# Patient Record
Sex: Male | Born: 1990 | Hispanic: Yes | Marital: Married | Smoking: Current some day smoker
Health system: Southern US, Community
[De-identification: ages and names within clinical notes are randomized; demographics above are authoritative.]

## PROBLEM LIST (undated history)

## (undated) HISTORY — PX: TONSILLECTOMY: SUR1361

---
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
## 2012-06-28 ENCOUNTER — Encounter (HOSPITAL_COMMUNITY): Payer: Self-pay

## 2012-06-28 ENCOUNTER — Emergency Department (HOSPITAL_COMMUNITY)
Admission: EM | Admit: 2012-06-28 | Discharge: 2012-06-29 | Disposition: A | Discharge status: Home / Self Care | Payer: BC Managed Care – PPO | Attending: Emergency Medicine | Admitting: Emergency Medicine

## 2012-06-28 ENCOUNTER — Emergency Department (HOSPITAL_COMMUNITY): Payer: BC Managed Care – PPO

## 2012-06-28 DIAGNOSIS — R51 Headache: Secondary | ICD-10-CM | POA: Insufficient documentation

## 2012-06-28 DIAGNOSIS — J029 Acute pharyngitis, unspecified: Secondary | ICD-10-CM | POA: Insufficient documentation

## 2012-06-28 DIAGNOSIS — J3489 Other specified disorders of nose and nasal sinuses: Secondary | ICD-10-CM | POA: Insufficient documentation

## 2012-06-28 DIAGNOSIS — N509 Disorder of male genital organs, unspecified: Secondary | ICD-10-CM | POA: Insufficient documentation

## 2012-06-28 DIAGNOSIS — K5289 Other specified noninfective gastroenteritis and colitis: Secondary | ICD-10-CM | POA: Insufficient documentation

## 2012-06-28 DIAGNOSIS — K529 Noninfective gastroenteritis and colitis, unspecified: Secondary | ICD-10-CM

## 2012-06-28 DIAGNOSIS — R5381 Other malaise: Secondary | ICD-10-CM | POA: Insufficient documentation

## 2012-06-28 DIAGNOSIS — R509 Fever, unspecified: Secondary | ICD-10-CM | POA: Insufficient documentation

## 2012-06-28 DIAGNOSIS — M549 Dorsalgia, unspecified: Secondary | ICD-10-CM | POA: Insufficient documentation

## 2012-06-28 DIAGNOSIS — R52 Pain, unspecified: Secondary | ICD-10-CM | POA: Insufficient documentation

## 2012-06-28 DIAGNOSIS — R197 Diarrhea, unspecified: Secondary | ICD-10-CM | POA: Insufficient documentation

## 2012-06-28 DIAGNOSIS — F17200 Nicotine dependence, unspecified, uncomplicated: Secondary | ICD-10-CM | POA: Insufficient documentation

## 2012-06-28 DIAGNOSIS — R109 Unspecified abdominal pain: Secondary | ICD-10-CM | POA: Insufficient documentation

## 2012-06-28 LAB — BASIC METABOLIC PANEL
BUN: 12 mg/dL (ref 6–23)
Calcium: 9.4 mg/dL (ref 8.4–10.5)
GFR calc Af Amer: >90 mL/min (ref >90)
GFR calc non Af Amer: >90 mL/min (ref >90)
Glucose, Bld: 110 mg/dL — ABNORMAL HIGH (ref 70–99)
Sodium: 140 mEq/L (ref 135–145)

## 2012-06-28 LAB — CBC WITH DIFFERENTIAL/PLATELET
Basophils Relative: 0 % (ref 0–1)
Eosinophils Absolute: 0 10*3/uL (ref 0.0–0.7)
MCH: 30.1 pg (ref 26.0–34.0)
MCHC: 34.9 g/dL (ref 30.0–36.0)
Neutrophils Relative %: 82 % — ABNORMAL HIGH (ref 43–77)
Platelets: 170 10*3/uL (ref 150–400)
RBC: 5.34 MIL/uL (ref 4.22–5.81)

## 2012-06-28 MED ORDER — ONDANSETRON HCL 4 MG/2ML IJ SOLN
4.0000 mg | Freq: Once | INTRAMUSCULAR | Status: AC
  Administered 2012-06-28: 4 mg via INTRAVENOUS
  Filled 2012-06-28: qty 2

## 2012-06-28 MED ORDER — IOHEXOL 300 MG/ML  SOLN
100.0000 mL | Freq: Once | INTRAMUSCULAR | Status: AC | PRN
  Administered 2012-06-28: 100 mL via INTRAVENOUS

## 2012-06-28 MED ORDER — PROMETHAZINE HCL 25 MG PO TABS: 25.0000 mg | ORAL_TABLET | Freq: Four times a day (QID) | ORAL | Status: DC | PRN

## 2012-06-28 MED ORDER — SODIUM CHLORIDE 0.9 % IV SOLN: INTRAVENOUS | Status: DC

## 2012-06-28 MED ORDER — ONDANSETRON 4 MG PO TBDP
4.0000 mg | ORAL_TABLET | Freq: Once | ORAL | Status: AC
  Administered 2012-06-28: 4 mg via ORAL
  Filled 2012-06-28: qty 1

## 2012-06-28 MED ORDER — ACETAMINOPHEN 325 MG PO TABS
650.0000 mg | ORAL_TABLET | Freq: Once | ORAL | Status: AC
  Administered 2012-06-28: 650 mg via ORAL
  Filled 2012-06-28: qty 2

## 2012-06-28 MED ORDER — LOPERAMIDE HCL 2 MG PO TABS: 2.0000 mg | ORAL_TABLET | Freq: Four times a day (QID) | ORAL | Status: DC | PRN

## 2012-06-28 MED ORDER — SODIUM CHLORIDE 0.9 % IV BOLUS (SEPSIS)
1000.0000 mL | Freq: Once | INTRAVENOUS | Status: AC
  Administered 2012-06-28: 1000 mL via INTRAVENOUS

## 2012-06-28 MED ORDER — HYDROMORPHONE HCL PF 1 MG/ML IJ SOLN
1.0000 mg | Freq: Once | INTRAMUSCULAR | Status: AC
  Administered 2012-06-28: 1 mg via INTRAVENOUS
  Filled 2012-06-28: qty 1

## 2012-06-28 MED ORDER — HYDROCODONE-ACETAMINOPHEN 5-325 MG PO TABS: 1.0000 | ORAL_TABLET | Freq: Four times a day (QID) | ORAL | Status: DC | PRN

## 2012-06-28 MED ORDER — IOHEXOL 300 MG/ML  SOLN
50.0000 mL | Freq: Once | INTRAMUSCULAR | Status: AC | PRN
  Administered 2012-06-28: 50 mL via ORAL

## 2012-06-28 NOTE — ED Notes (Signed)
Pt reports woke up at 0130 this morning with abd pain.  Reports had some diarrhea.  Reports woke up feeling tired.  After eating started having stomach pain again.  C/O nausea, no vomiting.  Reports has had a total of 3 loose stools today.

## 2012-06-28 NOTE — ED Provider Notes (Signed)
History    Scribed for No att. providers found, the patient was seen in room APA10/APA10. This chart was scribed by Lewanda Rife, ED scribe. Patient's care was started at 1915   CSN: [id number]  Arrival date & time 06/28/12  1505   First MD Initiated Contact with Patient 06/28/12 1814      Chief Complaint  Patient presents with  . Fatigue  . Nausea    (Consider location/radiation/quality/duration/timing/severity/associated sxs/prior treatment) The history is provided by the patient.  HPI Comments: Jason Guerra is a 22 y.o. male who presents to the Emergency Department complaining of resolved severe"central" abdominal pain onset 1:30 am this morning 1 episode lasting 10 minutes. Reports abdominal pain was 10/10 in severity. Reports abdominal pain and nausea have resolved at this time, but attributes it to "food poisoning". Reports associated loose stools x 3 episodes, fatigue, nausea, subjective fever, headaches, fatigue, generalized myalgias, sore throat, rhinorrhea, and back pain. Denies emesis, diarrhea, hematochezia, melena, chest pain, shortness of breath, hematuria, leg swelling, rash, and bleeding easily. Additionally reports, right groin pain, enlarged right testicle onset 1 year. Reports he has been evaluated for groin pain and right testicle by general surgeon Dr. Malvin Johns with no concerning findings. Denies aggravating or alleviating factors. Denies taking any pain medications PTA to relieve symptoms. No PCP. Denies hx of abdominal surgery.   History reviewed. No pertinent past medical history.  Past Surgical History  Procedure Laterality Date  . Tonsillectomy      Family History  Problem Relation Age of Onset  . Diabetes Mother   . Diabetes Other     History  Substance Use Topics  . Smoking status: Current Some Day Smoker  . Smokeless tobacco: Never Used  . Alcohol Use: No      Review of Systems  Constitutional: Positive for fever and fatigue.  HENT:  Positive for sore throat and rhinorrhea.   Eyes: Negative for visual disturbance.  Respiratory: Positive for cough. Negative for shortness of breath.   Cardiovascular: Negative for chest pain and leg swelling.  Gastrointestinal: Positive for nausea, abdominal pain and diarrhea. Negative for vomiting and blood in stool.  Genitourinary: Positive for testicular pain. Negative for dysuria and hematuria.  Musculoskeletal: Positive for myalgias and back pain.  Skin: Negative for rash.  Neurological: Positive for headaches.  Hematological: Does not bruise/bleed easily.  Psychiatric/Behavioral: Negative for confusion.   A complete 10 system review of systems was obtained and all systems are negative except as noted in the HPI and PMH.    Allergies  Review of patient's allergies indicates no known allergies.  Home Medications   Current Outpatient Rx  Name  Route  Sig  Dispense  Refill  . HYDROcodone-acetaminophen (NORCO/VICODIN) 5-325 MG per tablet   Oral   Take 1-2 tablets by mouth every 6 (six) hours as needed for pain.   10 tablet   0   . loperamide (IMODIUM A-D) 2 MG tablet   Oral   Take 1 tablet (2 mg total) by mouth 4 (four) times daily as needed for diarrhea or loose stools.   30 tablet   0   . EXPIRED: promethazine (PHENERGAN) 25 MG tablet   Oral   Take 1 tablet (25 mg total) by mouth every 6 (six) hours as needed for nausea.   15 tablet   0   . promethazine (PHENERGAN) 25 MG tablet   Oral   Take 1 tablet (25 mg total) by mouth every 6 (six) hours as needed for  nausea.   12 tablet   0     BP 134/75  Pulse 68  Temp(Src) 100.3 F (37.9 C) (Oral)  Resp 16  Ht 5\' 11"  (1.803 m)  Wt 220 lb (99.791 kg)  BMI 30.7 kg/m2  SpO2 99%  Physical Exam  Nursing note and vitals reviewed. Constitutional: He is oriented to person, place, and time. He appears well-developed and well-nourished. No distress.  HENT:  Head: Normocephalic and atraumatic.  Mouth/Throat: Oropharynx  is clear and moist. No oropharyngeal exudate.  Eyes: EOM are normal.  Neck: Neck supple. No tracheal deviation present.  Cardiovascular: Normal rate and regular rhythm.   No murmur heard. Pulmonary/Chest: Effort normal and breath sounds normal. No respiratory distress. He has no wheezes. He has no rales.  Abdominal: Soft. Bowel sounds are normal. He exhibits no distension. There is no tenderness. Hernia confirmed negative in the right inguinal area and confirmed negative in the left inguinal area.  Genitourinary: Testes normal and penis normal. Right testis shows no mass, no swelling and no tenderness. Left testis shows no mass, no swelling and no tenderness. No penile tenderness.  Musculoskeletal: Normal range of motion. He exhibits no edema and no tenderness.  Lymphadenopathy:       Right: No inguinal adenopathy present.       Left: No inguinal adenopathy present.  Neurological: He is alert and oriented to person, place, and time.  Skin: Skin is warm and dry.  Psychiatric: He has a normal mood and affect. His behavior is normal.    ED Course  Procedures (including critical care time) Medications  ondansetron (ZOFRAN-ODT) disintegrating tablet 4 mg (4 mg Oral Given 06/28/12 1955)  acetaminophen (TYLENOL) tablet 650 mg (650 mg Oral Given 06/28/12 2101)  sodium chloride 0.9 % bolus 1,000 mL (0 mLs Intravenous Stopped 06/29/12 0006)  ondansetron (ZOFRAN) injection 4 mg (4 mg Intravenous Given 06/28/12 2235)  iohexol (OMNIPAQUE) 300 MG/ML solution 50 mL (50 mLs Oral Contrast Given 06/28/12 2140)  iohexol (OMNIPAQUE) 300 MG/ML solution 100 mL (100 mLs Intravenous Contrast Given 06/28/12 2241)  HYDROmorphone (DILAUDID) injection 1 mg (1 mg Intravenous Given 06/28/12 2359)    Labs Reviewed  BASIC METABOLIC PANEL - Abnormal; Notable for the following:    Glucose, Bld 110 (*)    All other components within normal limits  CBC WITH DIFFERENTIAL - Abnormal; Notable for the following:    Neutrophils  Relative % 82 (*)    Neutro Abs 7.8 (*)    Lymphocytes Relative 10 (*)    All other components within normal limits   Ct Abdomen Pelvis W Contrast  06/28/2012   *RADIOLOGY REPORT*  Clinical Data: Nausea and fatigueNausea, diarrhea, abdominal pain, right and groin pain, testicular pain  CT ABDOMEN AND PELVIS WITH CONTRAST  Technique:  Multidetector CT imaging of the abdomen and pelvis was performed following the standard protocol during bolus administration of intravenous contrast.  Contrast: 50mL OMNIPAQUE IOHEXOL 300 MG/ML  SOLN, OMNIPAQUE IOHEXOL 300 MG/ML  SOLN  Comparison: Plain film 06/20/2012  Findings: Lung bases are clear.  No pericardial fluid.  No focal hepatic lesion.  The gallbladder, pancreas, spleen, adrenal glands, and kidneys are normal.  The stomach, small bowel, appendix, cecum are normal.  The colon and rectosigmoid colon are normal.  Abdominal aorta is normal caliber.  No retroperitoneal periportal lymphadenopathy.  No free fluid the pelvis.  No distal ureteral stones or bladder stones.  Review of  bone windows demonstrates no aggressive osseous lesions.  IMPRESSION:  1.  No acute abdominal pelvic findings.  2.  Normal appendix. 3.  No evidence of hernia.   Original Report Authenticated By: Genevive Bi, M.D.   Dg Abd Acute W/chest  06/28/2012   *RADIOLOGY REPORT*  Clinical Data: Abdominal pain, nausea, diarrhea and fatigue.  ACUTE ABDOMEN SERIES (ABDOMEN 2 VIEW & CHEST 1 VIEW)  Comparison: Chest x-ray on 03/18/2010  Findings: The lungs are clear.  The heart size is normal. Abdominal films show no evidence of bowel obstruction or significant ileus.  There are some nondilated central small bowel loops containing gas as well as a few air fluid levels.  This may imply enteritis.  No abnormal calcifications are seen.  Bony structures are unremarkable.  IMPRESSION: No bowel obstruction.  Nondilated small bowel loops containing gas and a few air fluid levels may imply regional  enteritis.   Original Report Authenticated By: Irish Lack, M.D.   Results for orders placed during the hospital encounter of 06/28/12  BASIC METABOLIC PANEL      Result Value Range   Sodium 140  135 - 145 mEq/L   Potassium 3.7  3.5 - 5.1 mEq/L   Chloride 101  96 - 112 mEq/L   CO2 29  19 - 32 mEq/L   Glucose, Bld 110 (*) 70 - 99 mg/dL   BUN 12  6 - 23 mg/dL   Creatinine, Ser 1.61  0.50 - 1.35 mg/dL   Calcium 9.4  8.4 - 09.6 mg/dL   GFR calc non Af Amer >90  >90 mL/min   GFR calc Af Amer >90  >90 mL/min  CBC WITH DIFFERENTIAL      Result Value Range   WBC 9.6  4.0 - 10.5 K/uL   RBC 5.34  4.22 - 5.81 MIL/uL   Hemoglobin 16.1  13.0 - 17.0 g/dL   HCT 04.5  40.9 - 81.1 %   MCV 86.3  78.0 - 100.0 fL   MCH 30.1  26.0 - 34.0 pg   MCHC 34.9  30.0 - 36.0 g/dL   RDW 91.4  78.2 - 95.6 %   Platelets 170  150 - 400 K/uL   Neutrophils Relative % 82 (*) 43 - 77 %   Neutro Abs 7.8 (*) 1.7 - 7.7 K/uL   Lymphocytes Relative 10 (*) 12 - 46 %   Lymphs Abs 1.0  0.7 - 4.0 K/uL   Monocytes Relative 8  3 - 12 %   Monocytes Absolute 0.7  0.1 - 1.0 K/uL   Eosinophils Relative 0  0 - 5 %   Eosinophils Absolute 0.0  0.0 - 0.7 K/uL   Basophils Relative 0  0 - 1 %   Basophils Absolute 0.0  0.0 - 0.1 K/uL     1. Gastroenteritis       MDM   Workup without specific findings. Seems to be consistent with a gastroenteritis. No evidence of appendicitis gallbladder problems or other acute abdominal process. No significant leukocytosis. No significant electrolyte abnormalities. Patient improved in the emergency department. We'll treat as if it's a viral gastroenteritis with precautions.     I personally performed the services described in this documentation, which was scribed in my presence. The recorded information has been reviewed and is accurate.     Shelda Jakes, MD 06/30/12 [phone]

## 2012-06-28 NOTE — ED Notes (Signed)
Pt was eating a chocolate candy bar while in waiting area just before being taken to treatment room.

## 2012-06-28 NOTE — ED Notes (Signed)
Patient requesting something STRONGER than tylenol for generalized aches and pain, back pain, - states his headache has decreased some.

## 2013-03-07 ENCOUNTER — Emergency Department (HOSPITAL_COMMUNITY)
Admission: EM | Admit: 2013-03-07 | Discharge: 2013-03-07 | Disposition: A | Discharge status: Home / Self Care | Payer: Worker's Compensation | Attending: Emergency Medicine | Admitting: Emergency Medicine

## 2013-03-07 ENCOUNTER — Encounter (HOSPITAL_COMMUNITY): Payer: Self-pay | Admitting: Emergency Medicine

## 2013-03-07 DIAGNOSIS — Z23 Encounter for immunization: Secondary | ICD-10-CM | POA: Insufficient documentation

## 2013-03-07 DIAGNOSIS — S0502XA Injury of conjunctiva and corneal abrasion without foreign body, left eye, initial encounter: Secondary | ICD-10-CM

## 2013-03-07 DIAGNOSIS — S058X9A Other injuries of unspecified eye and orbit, initial encounter: Secondary | ICD-10-CM | POA: Insufficient documentation

## 2013-03-07 DIAGNOSIS — Y929 Unspecified place or not applicable: Secondary | ICD-10-CM | POA: Insufficient documentation

## 2013-03-07 DIAGNOSIS — T1590XA Foreign body on external eye, part unspecified, unspecified eye, initial encounter: Secondary | ICD-10-CM | POA: Insufficient documentation

## 2013-03-07 DIAGNOSIS — Y9389 Activity, other specified: Secondary | ICD-10-CM | POA: Insufficient documentation

## 2013-03-07 DIAGNOSIS — F17200 Nicotine dependence, unspecified, uncomplicated: Secondary | ICD-10-CM | POA: Insufficient documentation

## 2013-03-07 DIAGNOSIS — R51 Headache: Secondary | ICD-10-CM | POA: Insufficient documentation

## 2013-03-07 MED ORDER — TETRACAINE HCL 0.5 % OP SOLN
OPHTHALMIC | Status: AC
  Administered 2013-03-07: 20:00:00
  Filled 2013-03-07: qty 2

## 2013-03-07 MED ORDER — TETANUS-DIPHTH-ACELL PERTUSSIS 5-2.5-18.5 LF-MCG/0.5 IM SUSP
0.5000 mL | Freq: Once | INTRAMUSCULAR | Status: AC
  Administered 2013-03-07: 0.5 mL via INTRAMUSCULAR
  Filled 2013-03-07: qty 0.5

## 2013-03-07 MED ORDER — KETOROLAC TROMETHAMINE 10 MG PO TABS: 10.0000 mg | ORAL_TABLET | Freq: Three times a day (TID) | ORAL | Status: DC

## 2013-03-07 MED ORDER — TOBRAMYCIN 0.3 % OP SOLN
2.0000 [drp] | OPHTHALMIC | Status: DC
  Administered 2013-03-07: 2 [drp] via OPHTHALMIC
  Filled 2013-03-07: qty 5

## 2013-03-07 MED ORDER — KETOROLAC TROMETHAMINE 10 MG PO TABS
10.0000 mg | ORAL_TABLET | Freq: Once | ORAL | Status: AC
  Administered 2013-03-07: 10 mg via ORAL
  Filled 2013-03-07: qty 1

## 2013-03-07 MED ORDER — HYDROCODONE-ACETAMINOPHEN 5-325 MG PO TABS: 1.0000 | ORAL_TABLET | ORAL | Status: DC | PRN

## 2013-03-07 NOTE — ED Notes (Signed)
Pt reports was sanding hard wood flooring  this morning and wood shaving got in left eye.

## 2013-03-07 NOTE — ED Provider Notes (Signed)
Medical screening examination/treatment/procedure(s) were performed by non-physician practitioner and as supervising physician I was immediately available for consultation/collaboration.  EKG Interpretation   None         Zayyan Mullen L Russel Morain, MD 03/07/13 2227 

## 2013-03-07 NOTE — Discharge Instructions (Signed)
Your examination reveals a corneal abrasion. Please apply cool compresses to your eye 3-4 times daily. Use Tobrex every 4 hours for the next 5 days. Please use the Toradol 3 times daily with food. Use Norco for headache or pain if needed. Please use dark glasses, and a hat with a brim to protect your eye for additional light. Please see the eye specialist listed above if not improving in the next 5-7 days.

## 2013-03-07 NOTE — ED Provider Notes (Signed)
CSN: [id number]     Arrival date & time 03/07/13  1654 History   First MD Initiated Contact with Patient 03/07/13 1848     Chief Complaint  Patient presents with  . Eye Pain   (Consider location/radiation/quality/duration/timing/severity/associated sxs/prior Treatment) Patient is a 23 y.o. male presenting with eye pain. The history is provided by the patient.  Eye Pain This is a new problem. The current episode started today. The problem occurs constantly. The problem has been gradually worsening. Associated symptoms include headaches. Pertinent negatives include no abdominal pain, arthralgias, chest pain, coughing, nausea, neck pain or vomiting. Exacerbated by: bright lights. Treatments tried: irrigating the eye. The treatment provided no relief.    History reviewed. No pertinent past medical history. Past Surgical History  Procedure Laterality Date  . Tonsillectomy     Family History  Problem Relation Age of Onset  . Diabetes Mother   . Diabetes Other    History  Substance Use Topics  . Smoking status: Current Some Day Smoker  . Smokeless tobacco: Never Used  . Alcohol Use: No    Review of Systems  Constitutional: Negative for activity change.       All ROS Neg except as noted in HPI  HENT: Negative for nosebleeds.   Eyes: Positive for pain. Negative for photophobia and discharge.  Respiratory: Negative for cough, shortness of breath and wheezing.   Cardiovascular: Negative for chest pain and palpitations.  Gastrointestinal: Negative for nausea, vomiting, abdominal pain and blood in stool.  Genitourinary: Negative for dysuria, frequency and hematuria.  Musculoskeletal: Negative for arthralgias, back pain and neck pain.  Skin: Negative.   Neurological: Positive for headaches. Negative for dizziness, seizures and speech difficulty.  Psychiatric/Behavioral: Negative for hallucinations and confusion.    Allergies  Review of patient's allergies indicates no known  allergies.  Home Medications  No current outpatient prescriptions on file. BP 128/64  Pulse 65  Temp(Src) 98.2 F (36.8 C) (Oral)  Resp 18  SpO2 97% Physical Exam  Nursing note and vitals reviewed. Constitutional: He is oriented to person, place, and time. He appears well-developed and well-nourished.  Non-toxic appearance.  HENT:  Head: Normocephalic.  Right Ear: Tympanic membrane and external ear normal.  Left Ear: Tympanic membrane and external ear normal.  Eyes: EOM and lids are normal. Pupils are equal, round, and reactive to light. Lids are everted and swept, no foreign bodies found. Left eye exhibits no chemosis, no discharge, no exudate and no hordeolum. No foreign body present in the left eye. Left conjunctiva is injected. No scleral icterus. Right eye exhibits normal extraocular motion. Left eye exhibits normal extraocular motion.  Fundoscopic exam:      The left eye shows no AV nicking, no exudate, no hemorrhage and no papilledema.  Slit lamp exam:      The right eye shows no anterior chamber bulge.       The left eye shows corneal abrasion and fluorescein uptake. The left eye shows no foreign body, no hyphema and no anterior chamber bulge.    Neck: Normal range of motion. Neck supple. Carotid bruit is not present.  Cardiovascular: Normal rate, regular rhythm, normal heart sounds, intact distal pulses and normal pulses.   Pulmonary/Chest: Breath sounds normal. No respiratory distress.  Abdominal: Soft. Bowel sounds are normal. There is no tenderness. There is no guarding.  Musculoskeletal: Normal range of motion.  Lymphadenopathy:       Head (right side): No submandibular adenopathy present.  Head (left side): No submandibular adenopathy present.    He has no cervical adenopathy.  Neurological: He is alert and oriented to person, place, and time. He has normal strength. No cranial nerve deficit or sensory deficit.  Skin: Skin is warm and dry.  Psychiatric: He has a  normal mood and affect. His speech is normal.    ED Course  Procedures (including critical care time) Labs Review Labs Reviewed - No data to display Imaging Review No results found.  EKG Interpretation   None       MDM  No diagnosis found. *I have reviewed nursing notes, vital signs, and all appropriate lab and imaging results for this patient.**  Patient was standing hardwood floors earlier this morning when something flew into his eye. The patient here gated the area but states he still feels as though something is in the eye. Examination reveals a corneal abrasion of the left cornea. No foreign body appreciated under the lid or in the corneal area.  The patient will be treated with tobramycin, Toradol, and Norco for pain. Patient will see Dr. Grier Mitts if not improving.  Kathie Dike, PA-C 03/07/13 2004

## 2014-03-03 IMAGING — CT CT ABD-PELV W/ CM
2 of 4 series · 16 of 46 positions shown, 18 images · IV contrast (Omnipaque 300)
Comparison: Plain film 06/20/2012

CLINICAL DATA: Nausea and fatigueNausea, diarrhea, abdominal pain,
right and groin pain, testicular pain

CT ABDOMEN AND PELVIS WITH CONTRAST
TECHNIQUE: Multidetector CT imaging of the abdomen and pelvis was
performed following the standard protocol during bolus
administration of intravenous contrast.
Contrast: 50mL OMNIPAQUE IOHEXOL 300 MG/ML  SOLN, 100mL OMNIPAQUE
IOHEXOL 300 MG/ML  SOLN

[Series 2: abd_pel_with 5.0 b40f · axial · 0.80mm/px · z∈[-564,-108]mm · 13 of 99 slices shown, 15 images]
[im 4/99  soft-tissue]
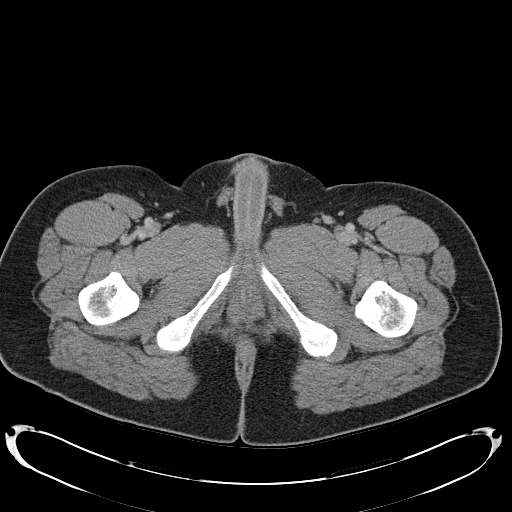
[im 4/99  bone]
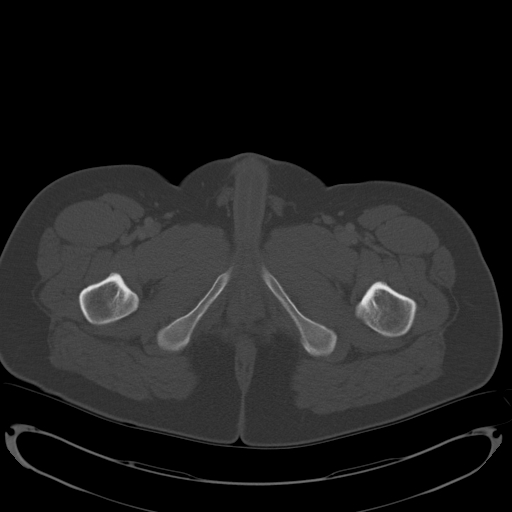
[im 12/99  soft-tissue]
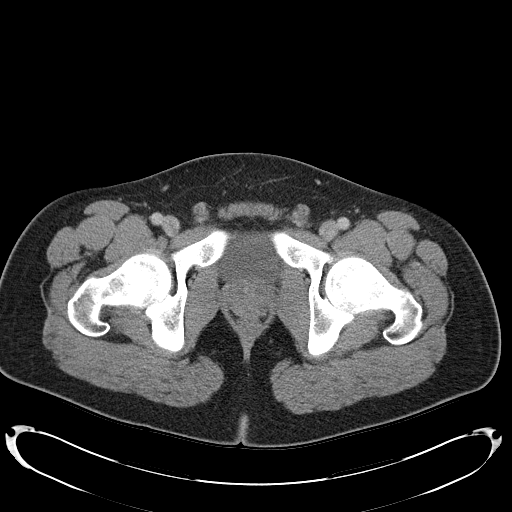
[im 20/99  soft-tissue]
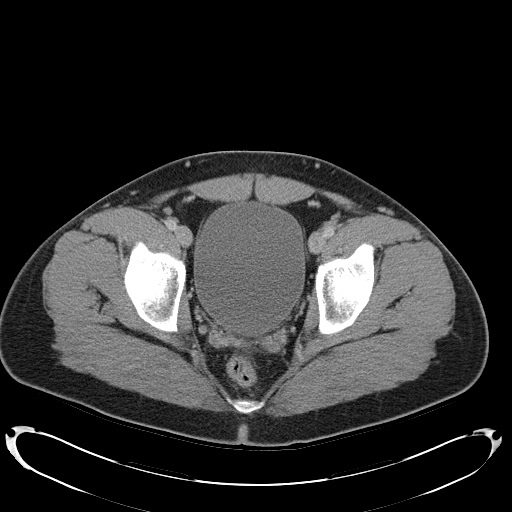
[im 28/99  soft-tissue]
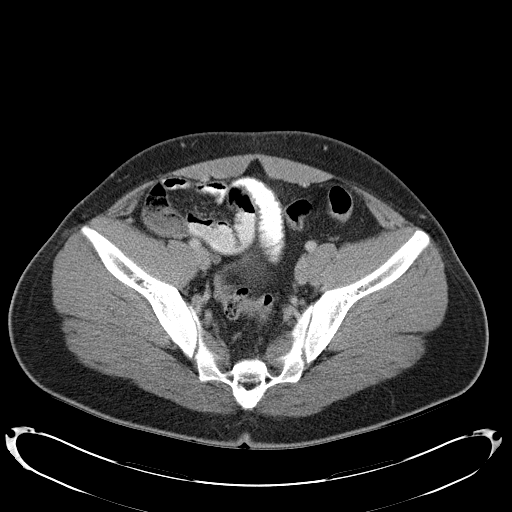
[im 36/99  soft-tissue]
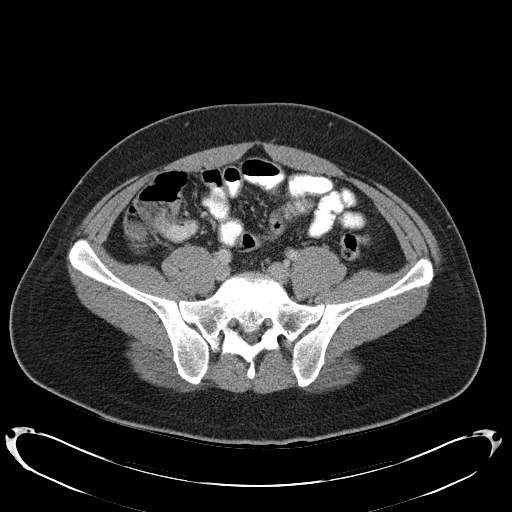
[im 44/99  soft-tissue]
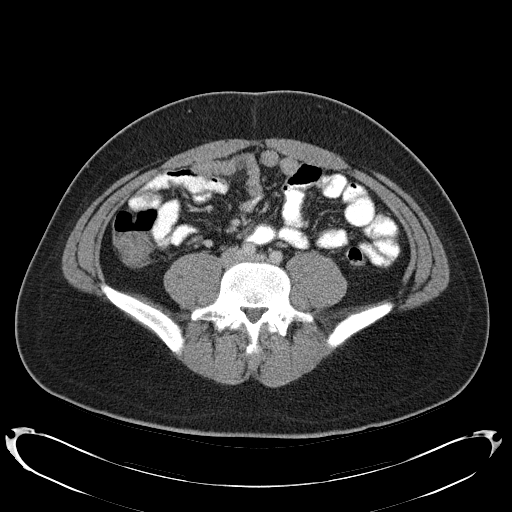
[im 51/99  soft-tissue]
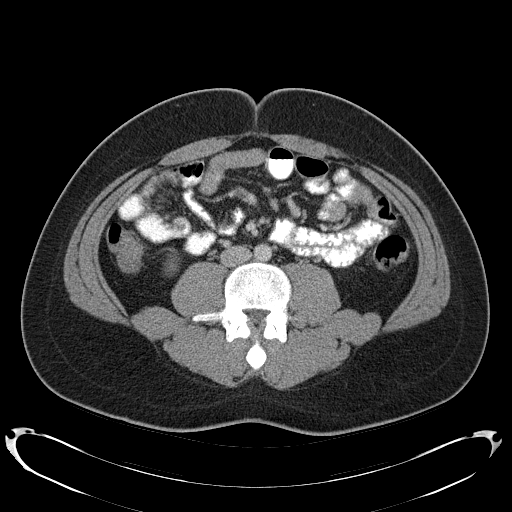
[im 55/99  soft-tissue]
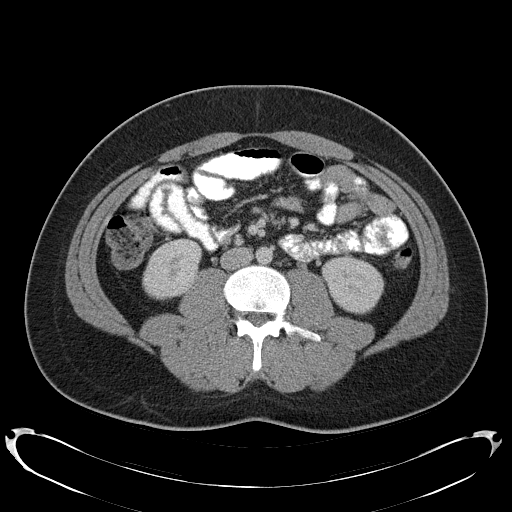
[im 63/99  soft-tissue]
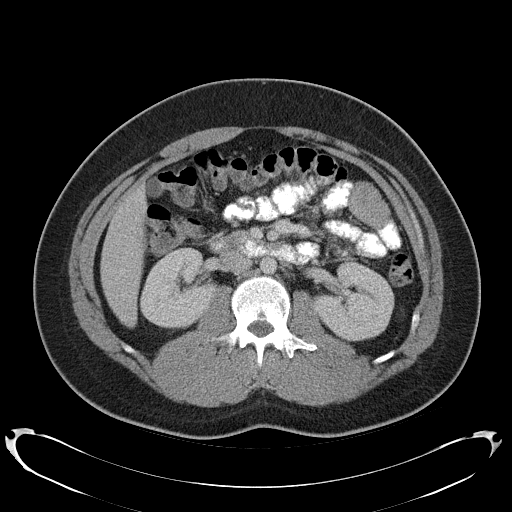
[im 63/99  bone]
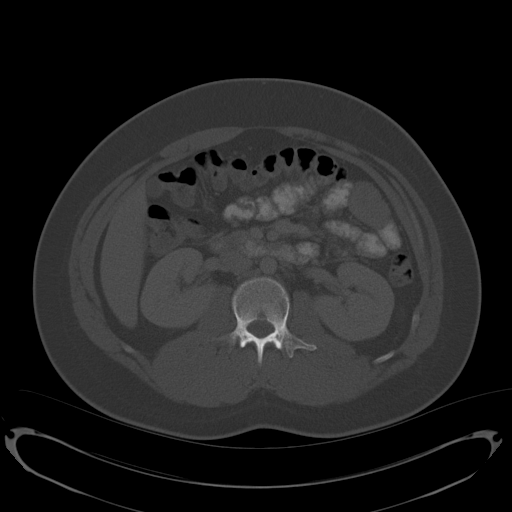
[im 71/99  soft-tissue]
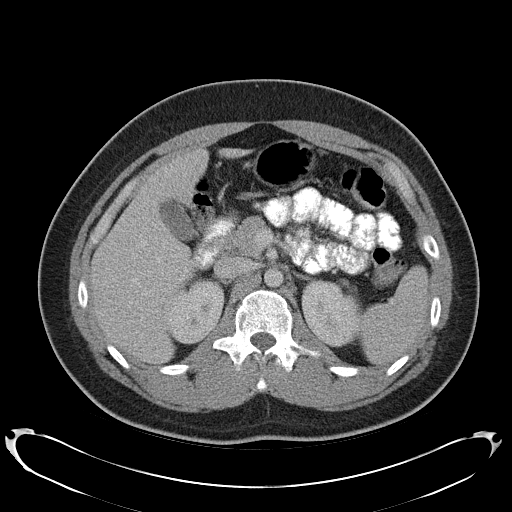
[im 79/99  soft-tissue]
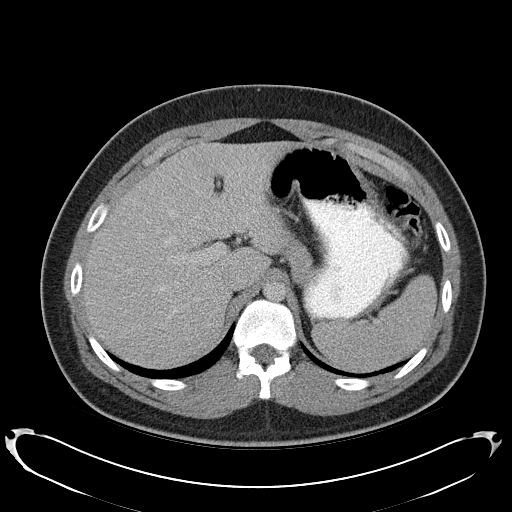
[im 87/99  soft-tissue]
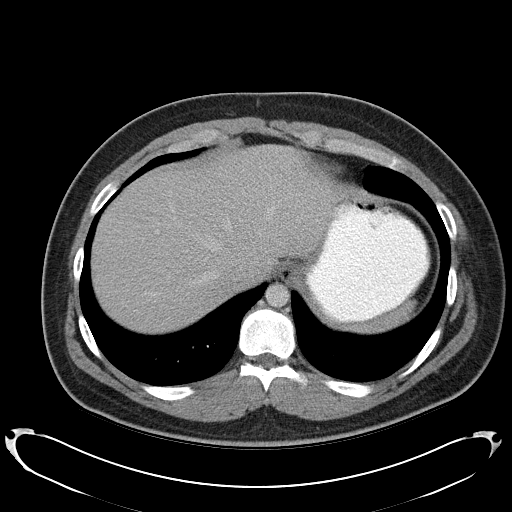
[im 95/99  soft-tissue]
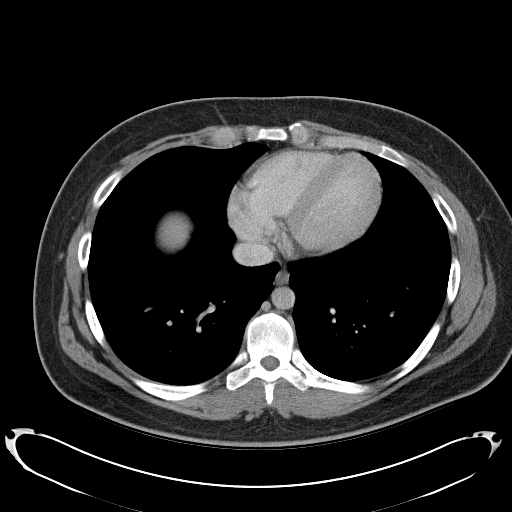

[Series 4: abd_pel_with 3.0 spo cor · coronal · 0.72mm/px · 3 of 90 slices shown]
[im 30/90  soft-tissue]
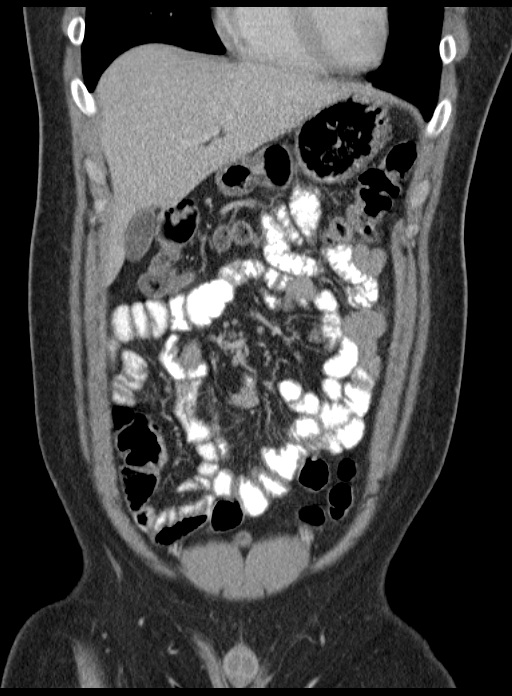
[im 40/90  soft-tissue]
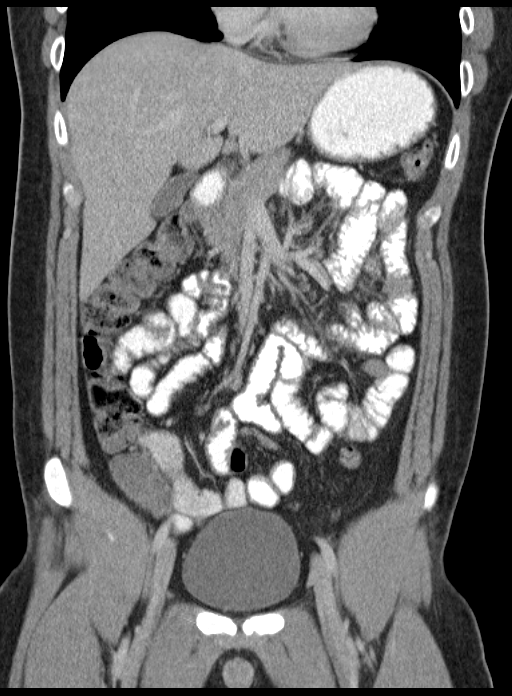
[im 50/90  soft-tissue]
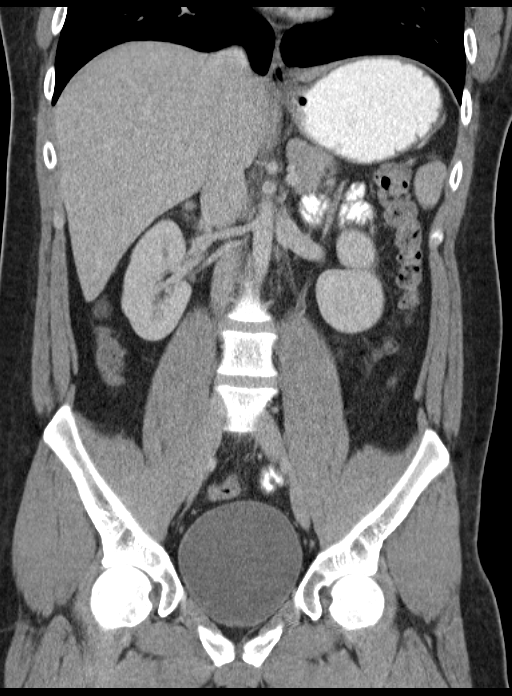

[16 of 46 positions shown; findings below may reference images not displayed]

FINDINGS: Lung bases are clear.  No pericardial fluid.

No focal hepatic lesion.  The gallbladder, pancreas, spleen,
adrenal glands, and kidneys are normal.

The stomach, small bowel, appendix, cecum are normal.  The colon
and rectosigmoid colon are normal.

Abdominal aorta is normal caliber.  No retroperitoneal periportal
lymphadenopathy.

No free fluid the pelvis.  No distal ureteral stones or bladder
stones.

Review of  bone windows demonstrates no aggressive osseous lesions.
IMPRESSION: 1..  No acute abdominal pelvic findings.

2.  Normal appendix.
3..  No evidence of hernia.

## 2018-12-19 ENCOUNTER — Other Ambulatory Visit: Payer: Self-pay

## 2018-12-19 DIAGNOSIS — Z20822 Contact with and (suspected) exposure to covid-19: Secondary | ICD-10-CM

## 2018-12-20 LAB — NOVEL CORONAVIRUS, NAA: SARS-CoV-2, NAA: DETECTED — AB

## 2018-12-27 ENCOUNTER — Other Ambulatory Visit: Payer: Self-pay

## 2018-12-27 DIAGNOSIS — Z20822 Contact with and (suspected) exposure to covid-19: Secondary | ICD-10-CM

## 2018-12-29 LAB — NOVEL CORONAVIRUS, NAA: SARS-CoV-2, NAA: NOT DETECTED

## 2019-09-08 ENCOUNTER — Encounter: Payer: Self-pay | Admitting: Emergency Medicine

## 2019-09-08 ENCOUNTER — Ambulatory Visit
Admission: EM | Admit: 2019-09-08 | Discharge: 2019-09-08 | Disposition: A | Discharge status: Home / Self Care | Payer: BC Managed Care – PPO | Attending: Emergency Medicine | Admitting: Emergency Medicine

## 2019-09-08 ENCOUNTER — Other Ambulatory Visit: Payer: Self-pay

## 2019-09-08 DIAGNOSIS — Z20822 Contact with and (suspected) exposure to covid-19: Secondary | ICD-10-CM

## 2019-09-08 DIAGNOSIS — R059 Cough, unspecified: Secondary | ICD-10-CM

## 2019-09-08 MED ORDER — BENZONATATE 100 MG PO CAPS: 100.0000 mg | ORAL_CAPSULE | Freq: Three times a day (TID) | ORAL | 0 refills | Status: DC

## 2019-09-08 NOTE — ED Triage Notes (Signed)
Pt was around family member recently that has been dx with covid. Pt reports he is having a mild cough that started today. Pt did have covid in Nov 2020, has not received the vaccine.

## 2019-09-08 NOTE — ED Provider Notes (Signed)
[facility]   [id number] 09/08/19 Arrival Time: 1314   CC: COVID symptoms  SUBJECTIVE: History from: patient.  Jason Guerra is a 29 y.o. male who presents with dry cough x 1 day.  Family member recently diagnosed with COVID.  Denies alleviating or aggravating factors.  Reports hx of COVID infection in the past.   Denies fever, chills, fatigue, sinus pain, rhinorrhea, sore throat, SOB, wheezing, chest pain, nausea, changes in bowel or bladder habits.    ROS: As per HPI.  All other pertinent ROS negative.     History reviewed. No pertinent past medical history. Past Surgical History:  Procedure Laterality Date  . TONSILLECTOMY     No Known Allergies No current facility-administered medications on file prior to encounter.   No current outpatient medications on file prior to encounter.   Social History   Socioeconomic History  . Marital status: Single    Spouse name: Not on file  . Number of children: Not on file  . Years of education: Not on file  . Highest education level: Not on file  Occupational History  . Not on file  Tobacco Use  . Smoking status: Current Some Day Smoker  . Smokeless tobacco: Never Used  Substance and Sexual Activity  . Alcohol use: Yes    Comment: occ  . Drug use: No  . Sexual activity: Not on file  Other Topics Concern  . Not on file  Social History Narrative  . Not on file   Social Determinants of Health   Financial Resource Strain:   . Difficulty of Paying Living Expenses:   Food Insecurity:   . Worried About Programme researcher, broadcasting/film/video in the Last Year:   . Barista in the Last Year:   Transportation Needs:   . Freight forwarder (Medical):   Marland Kitchen Lack of Transportation (Non-Medical):   Physical Activity:   . Days of Exercise per Week:   . Minutes of Exercise per Session:   Stress:   . Feeling of Stress :   Social Connections:   . Frequency of Communication with Friends and Family:   . Frequency of Social Gatherings  with Friends and Family:   . Attends Religious Services:   . Active Member of Clubs or Organizations:   . Attends Banker Meetings:   Marland Kitchen Marital Status:   Intimate Partner Violence:   . Fear of Current or Ex-Partner:   . Emotionally Abused:   Marland Kitchen Physically Abused:   . Sexually Abused:    Family History  Problem Relation Age of Onset  . Diabetes Mother   . Diabetes Other     OBJECTIVE:  Vitals:   09/08/19 1358 09/08/19 1400  BP:  121/77  Pulse:  65  Resp:  17  Temp:  98.5 F (36.9 C)  TempSrc:  Oral  SpO2:  96%  Weight: 218 lb 4.1 oz (99 kg)   Height: 5\' 11"  (1.803 m)      General appearance: alert; well-appearing, nontoxic; speaking in full sentences and tolerating own secretions HEENT: NCAT; Ears: EACs clear, TMs pearly gray; Eyes: PERRL.  EOM grossly intact.Nose: nares patent without rhinorrhea, Throat: oropharynx clear, tonsils non erythematous or enlarged, uvula midline  Neck: supple without LAD Lungs: unlabored respirations, symmetrical air entry; cough: absent; no respiratory distress; CTAB Heart: regular rate and rhythm.  Skin: warm and dry Psychological: alert and cooperative; normal mood and affect   ASSESSMENT & PLAN:  1. Exposure to COVID-19 virus  2. Cough     Meds ordered this encounter  Medications  . benzonatate (TESSALON) 100 MG capsule    Sig: Take 1 capsule (100 mg total) by mouth every 8 (eight) hours.    Dispense:  21 capsule    Refill:  0    Order Specific Question:   Supervising Provider    Answer:   Eustace Moore [[id number]]   COVID testing ordered.  It will take between 2-5 days for test results.  Someone will contact you regarding abnormal results.    In the meantime: You should remain isolated in your home for 10 days from symptom onset AND greater than 72 hours after symptoms resolution (absence of fever without the use of fever-reducing medication and improvement in respiratory symptoms), whichever is longer Get  plenty of rest and push fluids Tessalon Perles prescribed for cough Use OTC zyrtec for nasal congestion, runny nose, and/or sore throat Use OTC flonase for nasal congestion and runny nose Use medications daily for symptom relief Use OTC medications like ibuprofen or tylenol as needed fever or pain Call or go to the ED if you have any new or worsening symptoms such as fever, worsening cough, shortness of breath, chest tightness, chest pain, turning blue, changes in mental status, etc...   Reviewed expectations re: course of current medical issues. Questions answered. Outlined signs and symptoms indicating need for more acute intervention. Patient verbalized understanding. After Visit Summary given.         Rennis Harding, PA-C 09/08/19 1431

## 2019-09-08 NOTE — Discharge Instructions (Signed)

## 2019-09-09 LAB — NOVEL CORONAVIRUS, NAA: SARS-CoV-2, NAA: NOT DETECTED

## 2019-09-09 LAB — SARS-COV-2, NAA 2 DAY TAT

## 2022-03-28 ENCOUNTER — Ambulatory Visit
Admission: RE | Admit: 2022-03-28 | Discharge: 2022-03-28 | Disposition: A | Discharge status: Home / Self Care | Payer: No Typology Code available for payment source | Source: Ambulatory Visit | Attending: Family Medicine | Admitting: Family Medicine

## 2022-03-28 VITALS — BP 157/80 | HR 75 | Temp 97.8°F | Resp 19

## 2022-03-28 DIAGNOSIS — R1031 Right lower quadrant pain: Secondary | ICD-10-CM | POA: Diagnosis not present

## 2022-03-28 LAB — POCT URINALYSIS DIP (MANUAL ENTRY)
Bilirubin, UA: NEGATIVE
Blood, UA: NEGATIVE
Glucose, UA: NEGATIVE mg/dL
Ketones, POC UA: NEGATIVE mg/dL
Leukocytes, UA: NEGATIVE
Nitrite, UA: NEGATIVE
Protein Ur, POC: NEGATIVE mg/dL
Spec Grav, UA: 1.02 (ref 1.010–1.025)
Urobilinogen, UA: 0.2 E.U./dL
pH, UA: 5.5 (ref 5.0–8.0)

## 2022-03-28 NOTE — Discharge Instructions (Addendum)
Advised patient right lower abdominal pain is more than likely small right lower abdominal hernia.  Advised we will follow-up with lab results once received.  Advised/encouraged patient to avoid heavy/strenuous lifting for the next 7 to 10 days.  OTC Ibuprofen as needed is fine for pain, but not on an ongoing basis.  Advised if symptoms worsen and/or unresolved please follow-up with PCP or here for further evaluation which will include advanced imaging.

## 2022-03-28 NOTE — ED Triage Notes (Addendum)
[facility] with [facility] of right sided abd pain, that radiates down to his testicle and low back. Pt reports this has been off and on for months but noticed it gets worse when he works out, lifts something heavy ect. Pt reports pain is 2 constantly. \ but has gone up to a 7 when it gets upset has not noticed any testicular changes

## 2022-03-28 NOTE — ED Provider Notes (Signed)
[name]    CSN: [id number] Arrival date & time: 03/28/22  1313      History   Chief Complaint Chief Complaint  Patient presents with   Abdominal Pain    Pain in lower right abdomen and back. Also runs down to right testicle - Entered by patient   Testicle Pain   back pain    HPI Jason Guerra is a 32 y.o. male.    HPI Pleasant 32 year old male presents with right-sided abdominal pain that radiates to right testicle and right lower back.  Patient reports on/off right lower quadrant/right suprapubic pain for several months.  Reports pain increases when weight lifting or lifting objects that are heavy.  Reports pain can get up to 7/10.  PMH significant for obesity and cigarette smoker.  Patient is accompanied by his wife this afternoon.  History reviewed. No pertinent past medical history.  Patient Active Problem List   Diagnosis Date Noted   Puncture wound 08/03/2011   Knee injury 08/03/2011    Past Surgical History:  Procedure Laterality Date   TONSILLECTOMY         Home Medications    Prior to Admission medications   Not on File    Family History Family History  Problem Relation Age of Onset   Diabetes Mother    Diabetes Other     Social History Social History   Tobacco Use   Smoking status: Some Days    Types: Cigarettes   Smokeless tobacco: Never  Substance Use Topics   Alcohol use: Not Currently    Comment: occ   Drug use: No     Allergies   Patient has no known allergies.   Review of Systems Review of Systems  Gastrointestinal:  Positive for abdominal pain.  All other systems reviewed and are negative.    Physical Exam Triage Vital Signs ED Triage Vitals  Enc Vitals Group     BP 03/28/22 1332 (!) 157/80     Pulse Rate 03/28/22 1332 75     Resp 03/28/22 1332 19     Temp 03/28/22 1332 97.8 F (36.6 C)     Temp Source 03/28/22 1332 Oral     SpO2 03/28/22 1332 98 %     Weight --      Height --      Head  Circumference --      Peak Flow --      Pain Score 03/28/22 1331 2     Pain Loc --      Pain Edu? --      Excl. in [place]? --    No data found.  Updated Vital Signs BP (!) 157/80   Pulse 75   Temp 97.8 F (36.6 C) (Oral)   Resp 19   SpO2 98%      Physical Exam Vitals and nursing note reviewed.  Constitutional:      General: He is not in acute distress.    Appearance: He is well-developed. He is obese. He is not ill-appearing.  HENT:     Head: Normocephalic and atraumatic.     Mouth/Throat:     Mouth: Mucous membranes are moist.     Pharynx: Oropharynx is clear.  Cardiovascular:     Rate and Rhythm: Normal rate and regular rhythm.     Heart sounds: Normal heart sounds.  Pulmonary:     Effort: Pulmonary effort is normal.     Breath sounds: Normal breath sounds. No wheezing, rhonchi or rales.  Abdominal:     General: Bowel sounds are normal. There is no distension.     Palpations: Abdomen is rigid. There is no shifting dullness, fluid wave, hepatomegaly, splenomegaly, mass or pulsatile mass.     Tenderness: There is abdominal tenderness in the right lower quadrant. There is no right CVA tenderness, left CVA tenderness, guarding or rebound. Negative signs include Murphy's sign and McBurney's sign.     Hernia: No hernia is present.     Comments: RLQ/right-sided superior suprapubic area: Mildly TTP over ~9.0 cm x 6.0 cm possible right inguinal hernia  Neurological:     Mental Status: He is alert.      UC Treatments / Results  Labs (all labs ordered are listed, but only abnormal results are displayed) Labs Reviewed  COMPREHENSIVE METABOLIC PANEL  CBC WITH DIFFERENTIAL/PLATELET  POCT URINALYSIS DIP (MANUAL ENTRY)    EKG   Radiology No results found.  Procedures Procedures (including critical care time)  Medications Ordered in UC Medications - No data to display  Initial Impression / Assessment and Plan / UC Course  I have reviewed the triage vital signs and  the nursing notes.  Pertinent labs & imaging results that were available during my care of the patient were reviewed by me and considered in my medical decision making (see chart for details).     MDM: 1.  Right lower quadrant abdominal pain-more than likely superficial right superior inguinal hernia on exam, CBC with differential, CMP ordered. Advised patient right lower abdominal pain is more than likely small right lower abdominal hernia.  Advised we will follow-up with lab results once received.  Advised/encouraged patient to avoid heavy/strenuous lifting for the next 7 to 10 days.  OTC Ibuprofen as needed is fine for pain, but not on an ongoing basis.  Advised if symptoms worsen and/or unresolved please follow-up with PCP or here for further evaluation which will include advanced imaging.  Patient discharged home, hemodynamically stable.  Final Clinical Impressions(s) / UC Diagnoses   Final diagnoses:  RLQ abdominal pain     Discharge Instructions      Advised patient right lower abdominal pain is more than likely small right lower abdominal hernia.  Advised we will follow-up with lab results once received.  Advised/encouraged patient to avoid heavy/strenuous lifting for the next 7 to 10 days.  OTC Ibuprofen as needed is fine for pain, but not on an ongoing basis.  Advised if symptoms worsen and/or unresolved please follow-up with PCP or here for further evaluation which will include advanced imaging.     ED Prescriptions   None    PDMP not reviewed this encounter.   Eliezer Lofts, [place] 03/28/22 1439

## 2022-03-29 ENCOUNTER — Telehealth: Payer: Self-pay

## 2022-03-29 LAB — CBC WITH DIFFERENTIAL/PLATELET
Basophils Absolute: 0 10*3/uL (ref 0.0–0.2)
Basos: 1 %
EOS (ABSOLUTE): 0.1 10*3/uL (ref 0.0–0.4)
Eos: 2 %
Hematocrit: 46.4 % (ref 37.5–51.0)
Hemoglobin: 15.8 g/dL (ref 13.0–17.7)
Immature Grans (Abs): 0 10*3/uL (ref 0.0–0.1)
Immature Granulocytes: 0 %
Lymphocytes Absolute: 1.7 10*3/uL (ref 0.7–3.1)
Lymphs: 30 %
MCH: 29.9 pg (ref 26.6–33.0)
MCHC: 34.1 g/dL (ref 31.5–35.7)
MCV: 88 fL (ref 79–97)
Monocytes Absolute: 0.5 10*3/uL (ref 0.1–0.9)
Monocytes: 8 %
Neutrophils Absolute: 3.3 10*3/uL (ref 1.4–7.0)
Neutrophils: 59 %
Platelets: 169 10*3/uL (ref 150–450)
RBC: 5.28 x10E6/uL (ref 4.14–5.80)
RDW: 12.9 % (ref 11.6–15.4)
WBC: 5.6 10*3/uL (ref 3.4–10.8)

## 2022-03-29 LAB — COMPREHENSIVE METABOLIC PANEL
ALT: 21 IU/L (ref 0–44)
AST: 16 IU/L (ref 0–40)
Albumin/Globulin Ratio: 2.2 (ref 1.2–2.2)
Albumin: 4.6 g/dL (ref 4.3–5.2)
Alkaline Phosphatase: 77 IU/L (ref 44–121)
BUN/Creatinine Ratio: 18 (ref 9–20)
BUN: 14 mg/dL (ref 6–20)
Bilirubin Total: 0.2 mg/dL (ref 0.0–1.2)
CO2: 22 mmol/L (ref 20–29)
Calcium: 8.9 mg/dL (ref 8.7–10.2)
Chloride: 108 mmol/L — ABNORMAL HIGH (ref 96–106)
Creatinine, Ser: 0.79 mg/dL (ref 0.76–1.27)
Globulin, Total: 2.1 g/dL (ref 1.5–4.5)
Glucose: 109 mg/dL — ABNORMAL HIGH (ref 70–99)
Potassium: 4.3 mmol/L (ref 3.5–5.2)
Sodium: 142 mmol/L (ref 134–144)
Total Protein: 6.7 g/dL (ref 6.0–8.5)
eGFR: 123 mL/min/{1.73_m2} (ref >59)

## 2022-03-29 NOTE — Telephone Encounter (Signed)
TCT pt to follow up form recent visit. Pt denies any additional needs at this time.

## 2022-04-03 ENCOUNTER — Ambulatory Visit
Admission: EM | Admit: 2022-04-03 | Discharge: 2022-04-03 | Disposition: A | Discharge status: Home / Self Care | Payer: No Typology Code available for payment source

## 2022-04-03 DIAGNOSIS — R1084 Generalized abdominal pain: Secondary | ICD-10-CM

## 2022-04-03 NOTE — ED Provider Notes (Signed)
[facility]    CSN: [id number] Arrival date & time: 04/03/22  [id number]    HISTORY   Chief Complaint  Patient presents with   Abdominal Pain   HPI Jason Guerra is a pleasant, 32 y.o. male who presents to urgent care today. Patient was seen here at urgent care on March 28, 2022 for chief complaint of right lower quadrant pain.  Patient states today that he is feeling much better, has no more pain and would like a note to go back to work.  Vital signs are normal today.  Patient is well-appearing and in no acute distress.  The history is provided by the patient.   History reviewed. No pertinent past medical history. Patient Active Problem List   Diagnosis Date Noted   Puncture wound 08/03/2011   Knee injury 08/03/2011   Past Surgical History:  Procedure Laterality Date   TONSILLECTOMY      Home Medications    Prior to Admission medications   Not on File    Family History Family History  Problem Relation Age of Onset   Diabetes Mother    Diabetes Other    Social History Social History   Tobacco Use   Smoking status: Some Days    Types: Cigarettes   Smokeless tobacco: Never  Substance Use Topics   Alcohol use: Not Currently    Comment: occ   Drug use: No   Allergies   Patient has no known allergies.  Review of Systems Review of Systems Pertinent findings revealed after performing a 14 point review of systems has been noted in the history of present illness.  Physical Exam Vital Signs BP 132/82 (BP Location: Right Arm)   Pulse 66   Temp 98.3 F (36.8 C) (Oral)   Resp 18   SpO2 97%   No data found.  Physical Exam Vitals and nursing note reviewed.  Constitutional:      General: He is not in acute distress.    Appearance: Normal appearance. He is normal weight. He is not ill-appearing.  HENT:     Head: Normocephalic and atraumatic.  Eyes:     Extraocular Movements: Extraocular movements intact.     Conjunctiva/sclera: Conjunctivae normal.      Pupils: Pupils are equal, round, and reactive to light.  Cardiovascular:     Rate and Rhythm: Normal rate and regular rhythm.  Pulmonary:     Effort: Pulmonary effort is normal.     Breath sounds: Normal breath sounds.  Abdominal:     General: Abdomen is flat. Bowel sounds are normal.     Palpations: Abdomen is soft.  Musculoskeletal:        General: Normal range of motion.     Cervical back: Normal range of motion and neck supple.  Skin:    General: Skin is warm and dry.  Neurological:     General: No focal deficit present.     Mental Status: He is alert and oriented to person, place, and time. Mental status is at baseline.  Psychiatric:        Mood and Affect: Mood normal.        Behavior: Behavior normal.        Thought Content: Thought content normal.        Judgment: Judgment normal.     Visual Acuity Right Eye Distance:   Left Eye Distance:   Bilateral Distance:    Right Eye Near:   Left Eye Near:    Bilateral Near:  UC Couse / Diagnostics / Procedures:     Radiology No results found.  Procedures Procedures (including critical care time) EKG  Pending results:  Labs Reviewed - No data to display  Medications Ordered in UC: Medications - No data to display  UC Diagnoses / Final Clinical Impressions(s)   I have reviewed the triage vital signs and the nursing notes.  Pertinent labs & imaging results that were available during my care of the patient were reviewed by me and considered in my medical decision making (see chart for details).    Final diagnoses:  Generalized abdominal pain   Patient provided with note to return to work.  Please see discharge instructions below for details of plan of care as provided to patient. ED Prescriptions   None    PDMP not reviewed this encounter.  Pending results:  Labs Reviewed - No data to display  Discharge Instructions:   Discharge Instructions      Thank you for visiting [place] here at urgent  care.  Please let us know if there is anything else we can do for you.      Disposition Upon Discharge:  Condition: stable for discharge home  Patient presented with an acute illness with associated systemic symptoms and significant discomfort requiring urgent management. In my opinion, this is a condition that a prudent lay person (someone who possesses an average knowledge of health and medicine) may potentially expect to result in complications if not addressed urgently such as respiratory distress, impairment of bodily function or dysfunction of bodily organs.   Routine symptom specific, illness specific and/or disease specific instructions were discussed with the patient and/or caregiver at length.   As such, the patient has been evaluated and assessed, work-up was performed and treatment was provided in alignment with urgent care protocols and evidence based medicine.  Patient/parent/caregiver has been advised that the patient may require follow up for further testing and treatment if the symptoms continue in spite of treatment, as clinically indicated and appropriate.  Patient/parent/caregiver has been advised to return to the [facility] or PCP if no better; to PCP or the Emergency Department if new signs and symptoms develop, or if the current signs or symptoms continue to change or worsen for further workup, evaluation and treatment as clinically indicated and appropriate  The patient will follow up with their current PCP if and as advised. If the patient does not currently have a PCP we will assist them in obtaining one.   The patient may need specialty follow up if the symptoms continue, in spite of conservative treatment and management, for further workup, evaluation, consultation and treatment as clinically indicated and appropriate.  Patient/parent/caregiver verbalized understanding and agreement of plan as discussed.  All questions were addressed during visit.  Please see discharge  instructions below for further details of plan.  This office note has been dictated using Museum/gallery curator.  Unfortunately, this method of dictation can sometimes lead to typographical or grammatical errors.  I apologize for your inconvenience in advance if this occurs.  Please do not hesitate to reach out to me if clarification is needed.      Lynden Oxford Scales, [place] 04/03/22 [phone]

## 2022-04-03 NOTE — Discharge Instructions (Addendum)
Thank you for visiting [place] here at urgent care.  Please let us know if there is anything else we can do for you.

## 2022-04-03 NOTE — ED Triage Notes (Signed)
Patient states he was having pain to his abd that had improved.

## 2022-07-12 ENCOUNTER — Ambulatory Visit
Admission: EM | Admit: 2022-07-12 | Discharge: 2022-07-12 | Disposition: A | Discharge status: Home / Self Care | Payer: No Typology Code available for payment source | Attending: Nurse Practitioner | Admitting: Nurse Practitioner

## 2022-07-12 DIAGNOSIS — R197 Diarrhea, unspecified: Secondary | ICD-10-CM

## 2022-07-12 MED ORDER — ONDANSETRON 4 MG PO TBDP: 4.0000 mg | ORAL_TABLET | Freq: Three times a day (TID) | ORAL | 0 refills | Status: DC | PRN

## 2022-07-12 NOTE — ED Triage Notes (Signed)
Pt states he ate [place] [employer] then after he ate started having bad stomach cramps and diarrhea with headache, fatigue, nausea since yesterday. Took ibuprofen at 12:30.

## 2022-07-12 NOTE — Discharge Instructions (Addendum)
As we discussed, it sounds like you have food poisoning.  You can take the Zofran every 8 hours as needed for nausea/vomiting.  Continue ibuprofen alternating with Tylenol as needed for pain.  Recommend increasing hydration with plenty of fluids or Pedialyte to prevent dehydration.  If you feel like eating, recommend soft, easy to digest foods like bananas, rice, applesauce, and dry toast.

## 2022-07-12 NOTE — ED Provider Notes (Signed)
[facility]    CSN: [id number] Arrival date & time: 07/12/22  1854      History   Chief Complaint Chief Complaint  Patient presents with   Diarrhea    HPI Jason Guerra is a 32 y.o. male.   Patient presents today with abdominal cramping and diarrhea.  Reports symptoms started yesterday shortly after eating at a local fast food chain.  She endorses more than 10 episodes of diarrhea today, pure water and nonbloody in nature.  Also endorses some nausea, however no vomiting today.  Endorses abdominal cramping prior to diarrhea, headache, body aches, and chills.  Has had decreased appetite today, however it has been pushing fluids and eating soft foods.  Has tolerated liquids today without vomiting.  Patient denies recent foreign travel, recent antibiotic use, recent suspicious drinking water intake.    History reviewed. No pertinent past medical history.  Patient Active Problem List   Diagnosis Date Noted   Puncture wound 08/03/2011   Knee injury 08/03/2011    Past Surgical History:  Procedure Laterality Date   TONSILLECTOMY         Home Medications    Prior to Admission medications   Medication Sig Start Date End Date Taking? Authorizing Provider  ondansetron (ZOFRAN-ODT) 4 MG disintegrating tablet Take 1 tablet (4 mg total) by mouth every 8 (eight) hours as needed for nausea or vomiting. 07/12/22  Yes Valentino Nose, NP    Family History Family History  Problem Relation Age of Onset   Diabetes Mother    Diabetes Other     Social History Social History   Tobacco Use   Smoking status: Some Days    Types: Cigarettes   Smokeless tobacco: Never  Substance Use Topics   Alcohol use: Not Currently    Comment: occ   Drug use: No     Allergies   Patient has no known allergies.   Review of Systems Review of Systems Per HPI  Physical Exam Triage Vital Signs ED Triage Vitals [07/12/22 1907]  Enc Vitals Group     BP 131/83     Pulse Rate  82     Resp 16     Temp 99.7 F (37.6 C)     Temp Source Oral     SpO2 96 %     Weight      Height      Head Circumference      Peak Flow      Pain Score 4     Pain Loc      Pain Edu?      Excl. in [place]?    No data found.  Updated Vital Signs BP 131/83 (BP Location: Right Arm)   Pulse 82   Temp 99.7 F (37.6 C) (Oral)   Resp 16   SpO2 96%   Visual Acuity Right Eye Distance:   Left Eye Distance:   Bilateral Distance:    Right Eye Near:   Left Eye Near:    Bilateral Near:     Physical Exam Vitals and nursing note reviewed.  Constitutional:      General: He is not in acute distress.    Appearance: Normal appearance. He is not toxic-appearing.  HENT:     Head: Normocephalic and atraumatic.     Mouth/Throat:     Mouth: Mucous membranes are moist.     Pharynx: Oropharynx is clear. No posterior oropharyngeal erythema.  Cardiovascular:     Rate and Rhythm: Normal rate  and regular rhythm.  Pulmonary:     Effort: Pulmonary effort is normal. No respiratory distress.     Breath sounds: Normal breath sounds. No wheezing, rhonchi or rales.  Abdominal:     General: Abdomen is flat. Bowel sounds are normal. There is no distension.     Palpations: Abdomen is soft.     Tenderness: There is no abdominal tenderness. There is no right CVA tenderness, left CVA tenderness, guarding or rebound.  Musculoskeletal:     Cervical back: Normal range of motion.  Lymphadenopathy:     Cervical: No cervical adenopathy.  Skin:    General: Skin is warm and dry.     Capillary Refill: Capillary refill takes less than 2 seconds.     Coloration: Skin is not jaundiced or pale.     Findings: No erythema.  Neurological:     Mental Status: He is alert.     Motor: No weakness.     Gait: Gait normal.  Psychiatric:        Behavior: Behavior is cooperative.      UC Treatments / Results  Labs (all labs ordered are listed, but only abnormal results are displayed) Labs Reviewed - No data to  display  EKG   Radiology No results found.  Procedures Procedures (including critical care time)  Medications Ordered in UC Medications - No data to display  Initial Impression / Assessment and Plan / UC Course  I have reviewed the triage vital signs and the nursing notes.  Pertinent labs & imaging results that were available during my care of the patient were reviewed by me and considered in my medical decision making (see chart for details).   Patient is well-appearing, normotensive, afebrile, not tachycardic, not tachypneic, oxygenating well on room air.    1. Diarrhea, unspecified type Suspect food poisoning Vitals and exam today are reassuring Start Zofran every 8 hours as needed for nausea/vomiting Other supportive care discussed ER and return precautions also discussed Note given for work  The patient was given the opportunity to ask questions.  All questions answered to their satisfaction.  The patient is in agreement to this plan.    Final Clinical Impressions(s) / UC Diagnoses   Final diagnoses:  Diarrhea, unspecified type     Discharge Instructions      As we discussed, it sounds like you have food poisoning.  You can take the Zofran every 8 hours as needed for nausea/vomiting.  Continue ibuprofen alternating with Tylenol as needed for pain.  Recommend increasing hydration with plenty of fluids or Pedialyte to prevent dehydration.  If you feel like eating, recommend soft, easy to digest foods like bananas, rice, applesauce, and dry toast.    ED Prescriptions     Medication Sig Dispense Auth. Provider   ondansetron (ZOFRAN-ODT) 4 MG disintegrating tablet Take 1 tablet (4 mg total) by mouth every 8 (eight) hours as needed for nausea or vomiting. 20 tablet Valentino Nose, NP      PDMP not reviewed this encounter.   Valentino Nose, NP 07/12/22 1935

## 2022-07-18 ENCOUNTER — Emergency Department (HOSPITAL_COMMUNITY)
Admission: EM | Admit: 2022-07-18 | Discharge: 2022-07-18 | Disposition: A | Discharge status: Home / Self Care | Payer: No Typology Code available for payment source | Attending: Emergency Medicine | Admitting: Emergency Medicine

## 2022-07-18 ENCOUNTER — Other Ambulatory Visit: Payer: Self-pay

## 2022-07-18 ENCOUNTER — Encounter (HOSPITAL_COMMUNITY): Payer: Self-pay

## 2022-07-18 DIAGNOSIS — R109 Unspecified abdominal pain: Secondary | ICD-10-CM | POA: Diagnosis not present

## 2022-07-18 DIAGNOSIS — R197 Diarrhea, unspecified: Secondary | ICD-10-CM | POA: Insufficient documentation

## 2022-07-18 LAB — COMPREHENSIVE METABOLIC PANEL
ALT: 50 U/L — ABNORMAL HIGH (ref 0–44)
AST: 26 U/L (ref 15–41)
Albumin: 3.7 g/dL (ref 3.5–5.0)
Alkaline Phosphatase: 80 U/L (ref 38–126)
Anion gap: 8 (ref 5–15)
BUN: 12 mg/dL (ref 6–20)
CO2: 25 mmol/L (ref 22–32)
Calcium: 8.8 mg/dL — ABNORMAL LOW (ref 8.9–10.3)
Chloride: 103 mmol/L (ref 98–111)
Creatinine, Ser: 0.86 mg/dL (ref 0.61–1.24)
GFR, Estimated: >60 mL/min (ref >60)
Glucose, Bld: 112 mg/dL — ABNORMAL HIGH (ref 70–99)
Potassium: 3.7 mmol/L (ref 3.5–5.1)
Sodium: 136 mmol/L (ref 135–145)
Total Bilirubin: 0.6 mg/dL (ref 0.3–1.2)
Total Protein: 7.4 g/dL (ref 6.5–8.1)

## 2022-07-18 LAB — CBC
HCT: 46.9 % (ref 39.0–52.0)
Hemoglobin: 15.8 g/dL (ref 13.0–17.0)
MCH: 29 pg (ref 26.0–34.0)
MCHC: 33.7 g/dL (ref 30.0–36.0)
MCV: 86.1 fL (ref 80.0–100.0)
Platelets: 227 10*3/uL (ref 150–400)
RBC: 5.45 MIL/uL (ref 4.22–5.81)
RDW: 11.9 % (ref 11.5–15.5)
WBC: 6.5 10*3/uL (ref 4.0–10.5)
nRBC: 0 % (ref 0.0–0.2)

## 2022-07-18 LAB — URINALYSIS, ROUTINE W REFLEX MICROSCOPIC
Bilirubin Urine: NEGATIVE
Glucose, UA: NEGATIVE mg/dL
Hgb urine dipstick: NEGATIVE
Ketones, ur: NEGATIVE mg/dL
Leukocytes,Ua: NEGATIVE
Nitrite: NEGATIVE
Protein, ur: NEGATIVE mg/dL
Specific Gravity, Urine: 1.024 (ref 1.005–1.030)
pH: 5 (ref 5.0–8.0)

## 2022-07-18 LAB — LIPASE, BLOOD: Lipase: 37 U/L (ref 11–51)

## 2022-07-18 NOTE — ED Provider Notes (Signed)
[place] [facility] DEPARTMENT AT [facility] Provider Note   CSN: [id number] Arrival date & time: 07/18/22  1240     History  Chief Complaint  Patient presents with   Abdominal Pain   Diarrhea    Jason Guerra is a 32 y.o. male presenting with 1 week of watery diarrhea.  Patient states that he is unsure of how many episodes of loose stools he has daily but states that it is more than 3.  Patient states he is able to eat and drink without issue and has been slightly nauseous but that is since resolved over the past few days.  Patient denies any recent hospital stays, antibiotics, travel, funny tasting food, immunocompromised state, thyroid issues.  Patient states that today he has not had any episodes of diarrhea.  Patient tried 1 dose of Imodium and 1 dose of charcoal pills but states they did not help.  Patient has primary care appointment tomorrow to be evaluated.  Patient denied fever, emesis, chest pain, shortness of breath, chills, dysuria, change in sensation/motor skills, weakness  Home Medications Prior to Admission medications   Medication Sig Start Date End Date Taking? Authorizing Provider  ondansetron (ZOFRAN-ODT) 4 MG disintegrating tablet Take 1 tablet (4 mg total) by mouth every 8 (eight) hours as needed for nausea or vomiting. 07/12/22   Valentino Nose, NP      Allergies    Patient has no known allergies.    Review of Systems   Review of Systems  Gastrointestinal:  Positive for abdominal pain and diarrhea.    Physical Exam Updated Vital Signs BP 135/70   Pulse 76   Temp 98 F (36.7 C)   Resp 16   SpO2 95%  Physical Exam Vitals reviewed.  Constitutional:      General: He is not in acute distress. HENT:     Head: Normocephalic and atraumatic.     Mouth/Throat:     Mouth: Mucous membranes are moist.  Eyes:     Extraocular Movements: Extraocular movements intact.     Conjunctiva/sclera: Conjunctivae normal.     Pupils: Pupils are equal,  round, and reactive to light.  Cardiovascular:     Rate and Rhythm: Normal rate and regular rhythm.     Pulses: Normal pulses.     Heart sounds: Normal heart sounds.     Comments: 2+ bilateral radial/dorsalis pedis pulses with regular rate Pulmonary:     Effort: Pulmonary effort is normal. No respiratory distress.     Breath sounds: Normal breath sounds.  Abdominal:     Palpations: Abdomen is soft.     Tenderness: There is no abdominal tenderness. There is no guarding or rebound.  Musculoskeletal:        General: Normal range of motion.     Cervical back: Normal range of motion and neck supple.     Comments: 5 out of 5 bilateral grip/leg extension strength  Skin:    General: Skin is warm and dry.     Capillary Refill: Capillary refill takes less than 2 seconds.     Comments: No overlying skin color changes  Neurological:     General: No focal deficit present.     Mental Status: He is alert and oriented to person, place, and time.     Comments: Sensation intact in all 4 limbs  Psychiatric:        Mood and Affect: Mood normal.     ED Results / Procedures / Treatments  Labs (all labs ordered are listed, but only abnormal results are displayed) Labs Reviewed  COMPREHENSIVE METABOLIC PANEL - Abnormal; Notable for the following components:      Result Value   Glucose, Bld 112 (*)    Calcium 8.8 (*)    ALT 50 (*)    All other components within normal limits  URINALYSIS, ROUTINE W REFLEX MICROSCOPIC - Abnormal; Notable for the following components:   APPearance HAZY (*)    All other components within normal limits  LIPASE, BLOOD  CBC    EKG EKG Interpretation  Date/Time:  Sunday July 18 2022 13:18:15 EDT Ventricular Rate:  73 PR Interval:  128 QRS Duration: 102 QT Interval:  382 QTC Calculation: 420 R Axis:   75 Text Interpretation: Normal sinus rhythm Normal ECG When compared with ECG of 18-Mar-2010 11:17, PREVIOUS ECG IS PRESENT Confirmed by Cathren Laine ([id number]) on  07/18/2022 2:42:15 PM  Radiology No results found.  Procedures Procedures    Medications Ordered in ED Medications - No data to display  ED Course/ Medical Decision Making/ A&P                             Medical Decision Making Amount and/or Complexity of Data Reviewed Labs: ordered.   [name] End 32 y.o. presented today for diarrhea. Working DDx that I considered at this time includes, but not limited to, functional diarrhea, viral illness, C. difficile, dysentery, gastroenteritis, Giardia, traveler's diarrhea.  R/o DDx: C. difficile, dysentery, gastroenteritis, Giardia, traveler's diarrhea: These are considered less likely due to history of present illness and physical exam findings  Review of prior external notes: 07/12/2022 ED  Unique Tests and My Interpretation:  CMP: Unremarkable UA: Negative Lipase: Unremarkable CBC: Unremarkable EKG: Sinus 73 bpm, no ST abnormalities or blocks noted  Discussion with Independent Historian:  Girlfriend  Discussion of Management of Tests: None  Risk: Low: based on diagnostic testing/clinical impression and treatment plan  Risk Stratification Score: None  Plan: Patient presented for diarrhea. On exam patient was in no acute distress and stable vitals.  Patient was resting comfortably on his phone when I spoke to him.  Patient's physical exam was unremarkable.  Patient's labs are reassuring.  At this time I suspect patient most likely has a GI bug versus functional diarrhea and will proceed with supportive therapy including fiber pills, fiber diet, Imodium over-the-counter and to follow-up with his primary care provider.  Low suspicion of any life-threatening diagnosis, C. difficile, electrolyte abnormalities or dehydration due to labs and physical exam.  Patient was given return precautions. Patient stable for discharge at this time.  Patient verbalized understanding of plan.         Final Clinical Impression(s) / ED  Diagnoses Final diagnoses:  Diarrhea, unspecified type    Rx / DC Orders ED Discharge Orders     None         Remi Deter 07/18/22 1606    Melene Plan, DO 07/18/22 2313

## 2022-07-18 NOTE — ED Triage Notes (Signed)
Pt came in via POV d/t for the past week having diarrhea & intermittent abd cramps, no pain while in triage. Has been seen by UC once thinking he had food poisoning d/t having fever, body aches & chills along with the abd cramp & diarrhea. All s/s have been relieved except now he is still reporting diarrhea.

## 2022-07-18 NOTE — Discharge Instructions (Signed)
Please make the appointment with your primary care provider on Monday to be reevaluated.  Today your labs and physical exam are reassuring.  Please remain hydrated and drink electrolytes avoid dehydration or losing too much electrolytes.  You may try Imodium over-the-counter along with fiber pills.  You may also try probiotics.  Please try to eat bananas, rice, applesauce, toast to help with your symptoms and avoid caffeine overall food.  Please eat a diet high in fiber and follow-up with your primary care provider and remain hydrated.  If symptoms change or worsen please return to ER.

## 2022-07-18 NOTE — ED Notes (Signed)
Discharge instructions reviewed with patient. Patient questions answered and opportunity for education reviewed. Patient voices understanding of discharge instructions with no further questions. Patient ambulatory with steady gait to lobby.  

## 2023-01-31 ENCOUNTER — Ambulatory Visit
Admission: RE | Admit: 2023-01-31 | Discharge: 2023-01-31 | Disposition: A | Discharge status: Home / Self Care | Payer: No Typology Code available for payment source | Source: Ambulatory Visit | Attending: Nurse Practitioner | Admitting: Nurse Practitioner

## 2023-01-31 VITALS — BP 132/83 | HR 88 | Temp 98.8°F | Resp 15

## 2023-01-31 DIAGNOSIS — H6121 Impacted cerumen, right ear: Secondary | ICD-10-CM

## 2023-01-31 DIAGNOSIS — J069 Acute upper respiratory infection, unspecified: Secondary | ICD-10-CM | POA: Diagnosis not present

## 2023-01-31 DIAGNOSIS — R509 Fever, unspecified: Secondary | ICD-10-CM

## 2023-01-31 LAB — POC COVID19/FLU A&B COMBO
Covid Antigen, POC: NEGATIVE
Influenza A Antigen, POC: NEGATIVE
Influenza B Antigen, POC: NEGATIVE

## 2023-01-31 NOTE — ED Triage Notes (Signed)
Pt reports headache, chills, body ache, abdominal pain, fever, nausea  x 4 days,

## 2023-01-31 NOTE — Discharge Instructions (Addendum)
Your COVID, Influenza and Strep Test are all negative. You may have an Upper Respiratory Infection.  Continue with the Mucinex DM as discussed. We encourage conservative treatment with symptom relief. We encourage you to use Tylenol alternating with Ibuprofen for your fever if not contraindicated. (Remember to use as directed do not exceed daily dosing recommendations) We also encourage salt water gargles for your sore throat. You should also consider throat lozenges and chloraseptic spray.  Your cough can be soothed with a cough suppressant.  Deborx and peroxide for right ear impaction as discussed

## 2023-01-31 NOTE — ED Provider Notes (Addendum)
[facility]    CSN: [id number] Arrival date & time: 01/31/23  1231      History   Chief Complaint Chief Complaint  Patient presents with   Fever    Headache, body chills, body ache, stomach pain - Entered by patient    HPI Jason Guerra is a 32 y.o. male.   HPI  He is in today with his significant other for recurrent fever.  He reports that he first developed a fever on Saturday and treated with Advil.  He woke up was able to go to work on Sunday.  He reports that once he returned home on Sunday his symptoms returned with fever and bodyaches.  He took additional Advil and Mucinex slipped and his symptoms improved however after 4 hours and returned.  His highest temperature was 101.7.  He reports that this morning he was having some abdominal pain, nausea.  He denies any emesis.  He reports that he is able to eat.  He denies any headaches, dizziness, sore throat  shortness of breath, or chest pain. History reviewed. No pertinent past medical history.  Patient Active Problem List   Diagnosis Date Noted   Puncture wound 08/03/2011   Knee injury 08/03/2011    Past Surgical History:  Procedure Laterality Date   TONSILLECTOMY         Home Medications    Prior to Admission medications   Medication Sig Start Date End Date Taking? Authorizing Provider  ondansetron (ZOFRAN-ODT) 4 MG disintegrating tablet Take 1 tablet (4 mg total) by mouth every 8 (eight) hours as needed for nausea or vomiting. 07/12/22   Valentino Nose, NP    Family History Family History  Problem Relation Age of Onset   Diabetes Mother    Diabetes Other     Social History Social History   Tobacco Use   Smoking status: Some Days    Types: Cigarettes   Smokeless tobacco: Never  Substance Use Topics   Alcohol use: Not Currently    Comment: occ   Drug use: No     Allergies   Patient has no known allergies.   Review of Systems Review of Systems   Physical Exam Triage  Vital Signs ED Triage Vitals  Encounter Vitals Group     BP 01/31/23 1246 132/83     Systolic BP Percentile --      Diastolic BP Percentile --      Pulse Rate 01/31/23 1246 88     Resp 01/31/23 1246 15     Temp 01/31/23 1246 98.8 F (37.1 C)     Temp Source 01/31/23 1246 Oral     SpO2 01/31/23 1246 96 %     Weight --      Height --      Head Circumference --      Peak Flow --      Pain Score 01/31/23 1249 0     Pain Loc --      Pain Education --      Exclude from Growth Chart --    No data found.  Updated Vital Signs BP 132/83 (BP Location: Right Arm)   Pulse 88   Temp 98.8 F (37.1 C) (Oral)   Resp 15   SpO2 96%   Visual Acuity Right Eye Distance:   Left Eye Distance:   Bilateral Distance:    Right Eye Near:   Left Eye Near:    Bilateral Near:     Physical Exam Constitutional:  General: He is not in acute distress.    Appearance: He is normal weight. He is diaphoretic. He is not ill-appearing.  HENT:     Head: Normocephalic and atraumatic.     Right Ear: There is impacted cerumen.     Left Ear: There is no impacted cerumen.  Cardiovascular:     Rate and Rhythm: Normal rate and regular rhythm.     Pulses: Normal pulses.     Heart sounds: Normal heart sounds.  Pulmonary:     Effort: Pulmonary effort is normal.     Breath sounds: Normal breath sounds.  Musculoskeletal:        General: Normal range of motion.     Cervical back: Normal range of motion.  Skin:    General: Skin is warm.     Capillary Refill: Capillary refill takes less than 2 seconds.  Neurological:     General: No focal deficit present.     Mental Status: He is alert and oriented to person, place, and time.  Psychiatric:        Mood and Affect: Mood normal.        Behavior: Behavior normal.      UC Treatments / Results  Labs (all labs ordered are listed, but only abnormal results are displayed) Labs Reviewed  POC COVID19/FLU A&B COMBO - Normal    EKG   Radiology No  results found.  Procedures Procedures (including critical care time)  Medications Ordered in UC Medications - No data to display  Initial Impression / Assessment and Plan / UC Course  I have reviewed the triage vital signs and the nursing notes.  Pertinent labs & imaging results that were available during my care of the patient were reviewed by me and considered in my medical decision making (see chart for details).     Fever  Final Clinical Impressions(s) / UC Diagnoses   Final diagnoses:  Upper respiratory tract infection, unspecified type     Discharge Instructions      Your COVID, Influenza and Strep Test are all negative. You may have an Upper Respiratory Infection.  Continue with the Mucinex DM as discussed. We encourage conservative treatment with symptom relief. We encourage you to use Tylenol alternating with Ibuprofen for your fever if not contraindicated. (Remember to use as directed do not exceed daily dosing recommendations) We also encourage salt water gargles for your sore throat. You should also consider throat lozenges and chloraseptic spray.  Your cough can be soothed with a cough suppressant.  Deborx and peroxide for right ear impaction as discussed        ED Prescriptions   None    PDMP not reviewed this encounter.   Barbette Merino, NP 01/31/23 1353    Barbette Merino, NP 01/31/23 1356

## 2023-06-30 ENCOUNTER — Encounter: Payer: Self-pay | Admitting: Emergency Medicine

## 2023-06-30 ENCOUNTER — Ambulatory Visit
Admission: EM | Admit: 2023-06-30 | Discharge: 2023-06-30 | Disposition: A | Discharge status: Home / Self Care | Attending: Nurse Practitioner | Admitting: Nurse Practitioner

## 2023-06-30 DIAGNOSIS — R319 Hematuria, unspecified: Secondary | ICD-10-CM | POA: Insufficient documentation

## 2023-06-30 DIAGNOSIS — R300 Dysuria: Secondary | ICD-10-CM | POA: Insufficient documentation

## 2023-06-30 LAB — POCT URINALYSIS DIP (MANUAL ENTRY)
Bilirubin, UA: NEGATIVE
Blood, UA: NEGATIVE
Glucose, UA: NEGATIVE mg/dL
Ketones, POC UA: NEGATIVE mg/dL
Nitrite, UA: NEGATIVE
Protein Ur, POC: NEGATIVE mg/dL
Spec Grav, UA: 1.02
Urobilinogen, UA: 0.2 U/dL
pH, UA: 7

## 2023-06-30 NOTE — ED Provider Notes (Signed)
 [facility]    CSN: [id number] Arrival date & time: 06/30/23  1707      History   Chief Complaint No chief complaint on file.   HPI Jason Guerra is a 33 y.o. male.   The history is provided by the patient.   Patient presents with a 1 day history of pain with urination and hematuria.  Patient reports he has noticed 2 episodes of hematuria or blood-tinged urine over the past 24 hours.  He states he has had pain with urination since his symptoms started.  Patient reports he did have lower abdominal pain, but states that he had a bowel movement today and pain has since subsided.  Patient denies fever, chills, chest pain, abdominal pain, urinary frequency, urgency, decreased urine stream, flank pain, low back pain, penile discharge, or testicular/scrotal pain/swelling.  History reviewed. No pertinent past medical history.  Patient Active Problem List   Diagnosis Date Noted   Puncture wound 08/03/2011   Knee injury 08/03/2011    Past Surgical History:  Procedure Laterality Date   TONSILLECTOMY         Home Medications    Prior to Admission medications   Not on File    Family History Family History  Problem Relation Age of Onset   Diabetes Mother    Diabetes Other     Social History Social History   Tobacco Use   Smoking status: Some Days    Types: Cigarettes   Smokeless tobacco: Never  Substance Use Topics   Alcohol use: Not Currently    Comment: occ   Drug use: No     Allergies   Patient has no known allergies.   Review of Systems Review of Systems Per HPI  Physical Exam Triage Vital Signs ED Triage Vitals  Encounter Vitals Group     BP 06/30/23 1803 130/84     Systolic BP Percentile --      Diastolic BP Percentile --      Pulse Rate 06/30/23 1803 68     Resp 06/30/23 1803 18     Temp 06/30/23 1803 98.6 F (37 C)     Temp Source 06/30/23 1803 Oral     SpO2 06/30/23 1803 99 %     Weight --      Height --      Head  Circumference --      Peak Flow --      Pain Score 06/30/23 1804 1     Pain Loc --      Pain Education --      Exclude from Growth Chart --    No data found.  Updated Vital Signs BP 130/84 (BP Location: Right Arm)   Pulse 68   Temp 98.6 F (37 C) (Oral)   Resp 18   SpO2 99%   Visual Acuity Right Eye Distance:   Left Eye Distance:   Bilateral Distance:    Right Eye Near:   Left Eye Near:    Bilateral Near:     Physical Exam Vitals and nursing note reviewed.  Constitutional:      General: He is not in acute distress.    Appearance: Normal appearance.  HENT:     Head: Normocephalic.  Eyes:     Extraocular Movements: Extraocular movements intact.     Conjunctiva/sclera: Conjunctivae normal.     Pupils: Pupils are equal, round, and reactive to light.  Cardiovascular:     Rate and Rhythm: Normal rate and regular rhythm.  Pulses: Normal pulses.     Heart sounds: Normal heart sounds.  Pulmonary:     Effort: Pulmonary effort is normal. No respiratory distress.     Breath sounds: Normal breath sounds. No stridor. No wheezing, rhonchi or rales.  Abdominal:     General: Bowel sounds are normal.     Palpations: Abdomen is soft.     Tenderness: There is no abdominal tenderness. There is no right CVA tenderness or left CVA tenderness.  Genitourinary:    Comments: GU exam deferred, self swab performed  Musculoskeletal:     Cervical back: Normal range of motion.  Lymphadenopathy:     Cervical: No cervical adenopathy.  Skin:    General: Skin is warm and dry.  Neurological:     General: No focal deficit present.     Mental Status: He is alert and oriented to person, place, and time.  Psychiatric:        Mood and Affect: Mood normal.        Behavior: Behavior normal.      UC Treatments / Results  Labs (all labs ordered are listed, but only abnormal results are displayed) Labs Reviewed  POCT URINALYSIS DIP (MANUAL ENTRY) - Abnormal; Notable for the following  components:      Result Value   Leukocytes, UA Trace (*)    All other components within normal limits  URINE CULTURE  CYTOLOGY, (ORAL, ANAL, URETHRAL) ANCILLARY ONLY    EKG   Radiology No results found.  Procedures Procedures (including critical care time)  Medications Ordered in UC Medications - No data to display  Initial Impression / Assessment and Plan / UC Course  I have reviewed the triage vital signs and the nursing notes.  Pertinent labs & imaging results that were available during my care of the patient were reviewed by me and considered in my medical decision making (see chart for details).  Urinalysis does not indicate an obvious urinary tract infection.  Urine culture and cytology swab are pending.  On exam, patient does not have any abdominal tenderness, vital signs are stable, and he is well-appearing.  Difficult to determine exact cause of dysuria at this time.  Patient was advised if the results of the culture and cytology swab are negative and he is continue to experience symptoms, recommend follow-up with his PCP or with urology for further evaluation.  Supportive care recommendations were provided and discussed with the patient to include increasing fluids and over-the-counter analgesics.  Patient was in agreement with this plan of care and verbalizes understanding.  All questions were answered.  Patient stable for discharge.  Final Clinical Impressions(s) / UC Diagnoses   Final diagnoses:  Dysuria  Hematuria, unspecified type     Discharge Instructions      A urine culture and cytology swab are pending.  You will be contacted if the pending test results are positive.  You also have access to your results via MyChart. Make sure you are drinking at least 8-10 8 ounce glasses of water daily. Avoid caffeine such as tea, soda, or coffee while symptoms persist. You may take over-the-counter Tylenol  as needed for pain or discomfort. Develop a toileting schedule  that will allow you to urinate at least every 2 hours. As discussed, if the pending test results are negative and you are continuing to experience symptoms, recommend follow-up with your primary care physician or with urology for further evaluation. Follow-up as needed.   ED Prescriptions   None  PDMP not reviewed this encounter.   Hardy Lia, NP 06/30/23 1836

## 2023-06-30 NOTE — Discharge Instructions (Signed)
 A urine culture and cytology swab are pending.  You will be contacted if the pending test results are positive.  You also have access to your results via MyChart. Make sure you are drinking at least 8-10 8 ounce glasses of water daily. Avoid caffeine such as tea, soda, or coffee while symptoms persist. You may take over-the-counter Tylenol  as needed for pain or discomfort. Develop a toileting schedule that will allow you to urinate at least every 2 hours. As discussed, if the pending test results are negative and you are continuing to experience symptoms, recommend follow-up with your primary care physician or with urology for further evaluation. Follow-up as needed.

## 2023-06-30 NOTE — ED Triage Notes (Signed)
 Burning on urination and lower abd pain since yesterday.  States noticed some pink in his urine.  States pain felt better after having a bowel movement today.

## 2023-07-01 LAB — URINE CULTURE: Culture: NO GROWTH

## 2023-07-01 LAB — CYTOLOGY, (ORAL, ANAL, URETHRAL) ANCILLARY ONLY
Chlamydia: NEGATIVE
Comment: NEGATIVE
Comment: NEGATIVE
Comment: NORMAL
Neisseria Gonorrhea: NEGATIVE
Trichomonas: NEGATIVE

## 2023-07-04 ENCOUNTER — Ambulatory Visit (HOSPITAL_COMMUNITY): Payer: Self-pay

## 2024-01-21 ENCOUNTER — Ambulatory Visit
Admission: EM | Admit: 2024-01-21 | Discharge: 2024-01-21 | Disposition: A | Discharge status: Home / Self Care | Attending: Student | Admitting: Student

## 2024-01-21 DIAGNOSIS — J069 Acute upper respiratory infection, unspecified: Secondary | ICD-10-CM

## 2024-01-21 LAB — POC COVID19/FLU A&B COMBO
Covid Antigen, POC: NEGATIVE
Influenza A Antigen, POC: NEGATIVE
Influenza B Antigen, POC: NEGATIVE

## 2024-01-21 MED ORDER — ONDANSETRON 4 MG PO TBDP: 4.0000 mg | ORAL_TABLET | Freq: Three times a day (TID) | ORAL | 0 refills | Status: AC | PRN

## 2024-01-21 MED ORDER — PROMETHAZINE-DM 6.25-15 MG/5ML PO SYRP: 5.0000 mL | ORAL_SOLUTION | Freq: Four times a day (QID) | ORAL | 0 refills | Status: AC | PRN

## 2024-01-21 MED ORDER — LOPERAMIDE HCL 2 MG PO CAPS: 2.0000 mg | ORAL_CAPSULE | Freq: Four times a day (QID) | ORAL | 0 refills | Status: AC | PRN

## 2024-01-21 NOTE — Discharge Instructions (Addendum)
-  Your COVID and influenza tests were negative. -You have a virus, like the common cold.  Viruses typically last 5 to 7 days.  After 7 days, your symptoms should be improving rather than worsening.  If your symptoms improve, and then worsen again, this is when we worry about a sinus infection or a lung infection, and you should return for additional care. -Take the Zofran  (ondansetron ) up to 3 times daily for nausea and vomiting. Dissolve one pill under your tongue or between your teeth and your cheek. -Take the Imodium  (loperamide ) up to 4 times daily for diarrhea. -Promethazine  DM cough syrup for congestion/cough. This could make you drowsy, so take at night before bed.

## 2024-01-21 NOTE — ED Provider Notes (Signed)
 " [facility]    CSN: [id number] Arrival date & time: 01/21/24  9158      History   Chief Complaint No chief complaint on file.   HPI Jason Guerra is a 33 y.o. male presenting w viral symptoms.   Pt reports he has abd pain (points to epigastric region), nausea without vomiting, fatigue, diarrhea (1x today; 2-3x one day ago, and sore throat x 2 days.   Has attempted pepto bismal at home. Took ibuprofen  and tylenol  - last dose 60 minutes prior to visit.   The patient denies a history of pulmonary disease      HPI  History reviewed. No pertinent past medical history.  Patient Active Problem List   Diagnosis Date Noted   Puncture wound 08/03/2011   Knee injury 08/03/2011    Past Surgical History:  Procedure Laterality Date   TONSILLECTOMY         Home Medications    Prior to Admission medications  Medication Sig Start Date End Date Taking? Authorizing Provider  loperamide  (IMODIUM ) 2 MG capsule Take 1 capsule (2 mg total) by mouth 4 (four) times daily as needed for diarrhea or loose stools. 01/21/24  Yes Kamariyah Timberlake E, PA-C  ondansetron  (ZOFRAN -ODT) 4 MG disintegrating tablet Take 1 tablet (4 mg total) by mouth every 8 (eight) hours as needed for nausea or vomiting. 01/21/24  Yes Arlyss Leita BRAVO, PA-C  promethazine -dextromethorphan (PROMETHAZINE -DM) 6.25-15 MG/5ML syrup Take 5 mLs by mouth 4 (four) times daily as needed for cough. 01/21/24  Yes Arlyss Leita BRAVO, PA-C    Family History Family History  Problem Relation Age of Onset   Diabetes Mother    Diabetes Other     Social History Social History[1]   Allergies   Patient has no known allergies.   Review of Systems Review of Systems  Constitutional:  Positive for fatigue. Negative for appetite change, chills and fever.  HENT:  Positive for sore throat. Negative for congestion, ear pain, rhinorrhea, sinus pressure and sinus pain.   Eyes:  Negative for redness and visual disturbance.   Respiratory:  Negative for cough, chest tightness, shortness of breath and wheezing.   Cardiovascular:  Negative for chest pain and palpitations.  Gastrointestinal:  Positive for abdominal pain and diarrhea. Negative for constipation, nausea and vomiting.  Genitourinary:  Negative for dysuria, frequency and urgency.  Musculoskeletal:  Negative for myalgias.  Neurological:  Negative for dizziness, weakness and headaches.  Psychiatric/Behavioral:  Negative for confusion.   All other systems reviewed and are negative.    Physical Exam Triage Vital Signs ED Triage Vitals  Encounter Vitals Group     BP 01/21/24 0852 120/79     Girls Systolic BP Percentile --      Girls Diastolic BP Percentile --      Boys Systolic BP Percentile --      Boys Diastolic BP Percentile --      Pulse Rate 01/21/24 0852 78     Resp 01/21/24 0852 16     Temp 01/21/24 0852 98.9 F (37.2 C)     Temp Source 01/21/24 0852 Oral     SpO2 01/21/24 0852 96 %     Weight --      Height --      Head Circumference --      Peak Flow --      Pain Score 01/21/24 0854 3     Pain Loc --      Pain Education --  Exclude from Growth Chart --    No data found.  Updated Vital Signs BP 120/79 (BP Location: Right Arm)   Pulse 78   Temp 98.9 F (37.2 C) (Oral)   Resp 16   SpO2 96%   Visual Acuity Right Eye Distance:   Left Eye Distance:   Bilateral Distance:    Right Eye Near:   Left Eye Near:    Bilateral Near:     Physical Exam Vitals reviewed.  Constitutional:      General: He is not in acute distress.    Appearance: Normal appearance. He is not ill-appearing.  HENT:     Head: Normocephalic and atraumatic.     Right Ear: Tympanic membrane, ear canal and external ear normal. No tenderness. No middle ear effusion. There is no impacted cerumen. Tympanic membrane is not perforated, erythematous, retracted or bulging.     Left Ear: Tympanic membrane, ear canal and external ear normal. No tenderness.  No  middle ear effusion. There is no impacted cerumen. Tympanic membrane is not perforated, erythematous, retracted or bulging.     Nose: Nose normal. No congestion.     Mouth/Throat:     Mouth: Mucous membranes are moist.     Pharynx: Uvula midline. No oropharyngeal exudate or posterior oropharyngeal erythema.     Tonsils: No tonsillar exudate.  Eyes:     Extraocular Movements: Extraocular movements intact.     Pupils: Pupils are equal, round, and reactive to light.  Cardiovascular:     Rate and Rhythm: Normal rate and regular rhythm.     Heart sounds: Normal heart sounds.  Pulmonary:     Effort: Pulmonary effort is normal.     Breath sounds: Normal breath sounds. No decreased breath sounds, wheezing, rhonchi or rales.  Abdominal:     Palpations: Abdomen is soft.     Tenderness: There is no abdominal tenderness. There is no guarding or rebound.     Comments: No reproducible pain  Lymphadenopathy:     Cervical: No cervical adenopathy.     Right cervical: No superficial, deep or posterior cervical adenopathy.    Left cervical: No superficial, deep or posterior cervical adenopathy.  Skin:    Comments: No rash   Neurological:     General: No focal deficit present.     Mental Status: He is alert and oriented to person, place, and time.  Psychiatric:        Mood and Affect: Mood normal.        Behavior: Behavior normal.        Thought Content: Thought content normal.        Judgment: Judgment normal.      UC Treatments / Results  Labs (all labs ordered are listed, but only abnormal results are displayed) Labs Reviewed  POC COVID19/FLU A&B COMBO - Normal    EKG   Radiology No results found.  Procedures Procedures (including critical care time)  Medications Ordered in UC Medications - No data to display  Initial Impression / Assessment and Plan / UC Course  I have reviewed the triage vital signs and the nursing notes.  Pertinent labs & imaging results that were  available during my care of the patient were reviewed by me and considered in my medical decision making (see chart for details).     Patient is a pleasant 33 y.o. male presenting with viral URI. The patient is afebrile and nontachycardic.  Antipyretic has not been administered today.  -Covid negative -Influenza  negative  Will manage symptomatically with Zofran , Imodium , Promethazine  DM to have on hand, and over-the-counter medications.  Return precautions as below.  Final Clinical Impressions(s) / UC Diagnoses   Final diagnoses:  Viral URI     Discharge Instructions      -Your COVID and influenza tests were negative. -You have a virus, like the common cold.  Viruses typically last 5 to 7 days.  After 7 days, your symptoms should be improving rather than worsening.  If your symptoms improve, and then worsen again, this is when we worry about a sinus infection or a lung infection, and you should return for additional care. -Take the Zofran  (ondansetron ) up to 3 times daily for nausea and vomiting. Dissolve one pill under your tongue or between your teeth and your cheek. -Take the Imodium  (loperamide ) up to 4 times daily for diarrhea. -Promethazine  DM cough syrup for congestion/cough. This could make you drowsy, so take at night before bed.      ED Prescriptions     Medication Sig Dispense Auth. Provider   promethazine -dextromethorphan (PROMETHAZINE -DM) 6.25-15 MG/5ML syrup Take 5 mLs by mouth 4 (four) times daily as needed for cough. 118 mL Sayana Salley E, PA-C   loperamide  (IMODIUM ) 2 MG capsule Take 1 capsule (2 mg total) by mouth 4 (four) times daily as needed for diarrhea or loose stools. 18 capsule Samayra Hebel E, PA-C   ondansetron  (ZOFRAN -ODT) 4 MG disintegrating tablet Take 1 tablet (4 mg total) by mouth every 8 (eight) hours as needed for nausea or vomiting. 20 tablet Jerett Odonohue E, PA-C      PDMP not reviewed this encounter.     [1]  Social History Tobacco  Use   Smoking status: Some Days    Types: Cigarettes   Smokeless tobacco: Never  Substance Use Topics   Alcohol use: Not Currently    Comment: occ   Drug use: No     Arlyss Leita BRAVO, PA-C 01/21/24 9047  "

## 2024-01-21 NOTE — ED Triage Notes (Signed)
 P[t reports he has abd pain, fatigue, diarrhea , and sore throat x 2 days   Took ibuprofen  and tylenol
